# Patient Record
Sex: Male | Born: 1970 | Race: White | Hispanic: No | Marital: Married | Smoking: Former smoker
Health system: Southern US, Community
[De-identification: ages and names within clinical notes are randomized; demographics above are authoritative.]

## PROBLEM LIST (undated history)

## (undated) DIAGNOSIS — S7290XA Unspecified fracture of unspecified femur, initial encounter for closed fracture: Secondary | ICD-10-CM

## (undated) DIAGNOSIS — E785 Hyperlipidemia, unspecified: Secondary | ICD-10-CM

## (undated) DIAGNOSIS — E119 Type 2 diabetes mellitus without complications: Secondary | ICD-10-CM

## (undated) DIAGNOSIS — IMO0002 Reserved for concepts with insufficient information to code with codable children: Secondary | ICD-10-CM

## (undated) DIAGNOSIS — N200 Calculus of kidney: Secondary | ICD-10-CM

## (undated) DIAGNOSIS — I10 Essential (primary) hypertension: Secondary | ICD-10-CM

## (undated) DIAGNOSIS — F419 Anxiety disorder, unspecified: Secondary | ICD-10-CM

## (undated) DIAGNOSIS — F329 Major depressive disorder, single episode, unspecified: Secondary | ICD-10-CM

## (undated) HISTORY — DX: Hyperlipidemia, unspecified: E78.5

## (undated) HISTORY — PX: ROTATOR CUFF REPAIR: SHX139

## (undated) HISTORY — DX: Essential (primary) hypertension: I10

## (undated) HISTORY — DX: Anxiety disorder, unspecified: F41.9

## (undated) HISTORY — DX: Reserved for concepts with insufficient information to code with codable children: IMO0002

## (undated) HISTORY — DX: Calculus of kidney: N20.0

## (undated) HISTORY — PX: HERNIA REPAIR: SHX51

## (undated) HISTORY — DX: Major depressive disorder, single episode, unspecified: F32.9

## (undated) HISTORY — DX: Type 2 diabetes mellitus without complications: E11.9

## (undated) HISTORY — PX: CERVICAL SPINE SURGERY: SHX589

## (undated) HISTORY — PX: KNEE ARTHROSCOPY: SUR90

## (undated) HISTORY — DX: Unspecified fracture of unspecified femur, initial encounter for closed fracture: S72.90XA

---
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
## 1989-09-21 DIAGNOSIS — S7290XA Unspecified fracture of unspecified femur, initial encounter for closed fracture: Secondary | ICD-10-CM

## 1989-09-21 HISTORY — DX: Unspecified fracture of unspecified femur, initial encounter for closed fracture: S72.90XA

## 1991-09-22 DIAGNOSIS — IMO0002 Reserved for concepts with insufficient information to code with codable children: Secondary | ICD-10-CM

## 1991-09-22 HISTORY — PX: TRICEPS TENDON REPAIR: SHX2577

## 1991-09-22 HISTORY — DX: Reserved for concepts with insufficient information to code with codable children: IMO0002

## 1998-09-21 HISTORY — PX: KIDNEY STONE SURGERY: SHX686

## 1998-09-21 LAB — HM COLONOSCOPY: HM Colonoscopy: NORMAL

## 2005-11-29 ENCOUNTER — Emergency Department: Payer: Self-pay | Admitting: Emergency Medicine

## 2008-03-13 ENCOUNTER — Encounter: Payer: Self-pay | Admitting: Family Medicine

## 2008-04-21 DIAGNOSIS — F32A Depression, unspecified: Secondary | ICD-10-CM

## 2008-04-21 HISTORY — DX: Depression, unspecified: F32.A

## 2008-07-27 ENCOUNTER — Ambulatory Visit: Payer: Self-pay | Admitting: Family Medicine

## 2008-07-27 DIAGNOSIS — N200 Calculus of kidney: Secondary | ICD-10-CM | POA: Insufficient documentation

## 2008-07-27 DIAGNOSIS — H60399 Other infective otitis externa, unspecified ear: Secondary | ICD-10-CM | POA: Insufficient documentation

## 2008-07-27 DIAGNOSIS — H6690 Otitis media, unspecified, unspecified ear: Secondary | ICD-10-CM | POA: Insufficient documentation

## 2008-07-27 DIAGNOSIS — F411 Generalized anxiety disorder: Secondary | ICD-10-CM | POA: Insufficient documentation

## 2008-07-31 ENCOUNTER — Ambulatory Visit: Payer: Self-pay | Admitting: Professional

## 2008-08-03 ENCOUNTER — Ambulatory Visit: Payer: Self-pay | Admitting: Family Medicine

## 2008-08-03 DIAGNOSIS — E785 Hyperlipidemia, unspecified: Secondary | ICD-10-CM | POA: Insufficient documentation

## 2008-08-06 DIAGNOSIS — R7309 Other abnormal glucose: Secondary | ICD-10-CM | POA: Insufficient documentation

## 2008-08-07 LAB — CONVERTED CEMR LAB
Albumin: 4 g/dL (ref 3.5–5.2)
Alkaline Phosphatase: 56 units/L (ref 39–117)
BUN: 10 mg/dL (ref 6–23)
Bilirubin, Direct: 0.1 mg/dL (ref 0.0–0.3)
Calcium: 9.5 mg/dL (ref 8.4–10.5)
Creatinine, Ser: 0.8 mg/dL (ref 0.4–1.5)
Sodium: 144 meq/L (ref 135–145)
Total Bilirubin: 0.7 mg/dL (ref 0.3–1.2)
Total Protein: 6.7 g/dL (ref 6.0–8.3)
Triglycerides: 255 mg/dL (ref 0–149)

## 2008-08-13 ENCOUNTER — Telehealth: Payer: Self-pay | Admitting: Family Medicine

## 2008-08-29 ENCOUNTER — Telehealth: Payer: Self-pay | Admitting: Family Medicine

## 2008-08-30 ENCOUNTER — Ambulatory Visit: Payer: Self-pay | Admitting: Family Medicine

## 2008-09-24 ENCOUNTER — Telehealth: Payer: Self-pay | Admitting: Family Medicine

## 2008-11-09 ENCOUNTER — Ambulatory Visit: Payer: Self-pay | Admitting: Family Medicine

## 2008-11-21 LAB — CONVERTED CEMR LAB
CO2: 31 meq/L (ref 19–32)
Calcium: 9.5 mg/dL (ref 8.4–10.5)
Direct LDL: 192.9 mg/dL
GFR calc Af Amer: 139 mL/min
GFR calc non Af Amer: 115 mL/min
Sodium: 143 meq/L (ref 135–145)
Total CHOL/HDL Ratio: 8.9
Triglycerides: 241 mg/dL (ref 0–149)
VLDL: 48 mg/dL — ABNORMAL HIGH (ref 0–40)

## 2009-02-11 ENCOUNTER — Ambulatory Visit: Payer: Self-pay | Admitting: Family Medicine

## 2009-02-11 DIAGNOSIS — J441 Chronic obstructive pulmonary disease with (acute) exacerbation: Secondary | ICD-10-CM | POA: Insufficient documentation

## 2009-02-11 DIAGNOSIS — J019 Acute sinusitis, unspecified: Secondary | ICD-10-CM | POA: Insufficient documentation

## 2009-03-01 ENCOUNTER — Ambulatory Visit: Payer: Self-pay | Admitting: Family Medicine

## 2009-03-04 LAB — CONVERTED CEMR LAB
Cholesterol: 274 mg/dL — ABNORMAL HIGH (ref 0–200)
Direct LDL: 194.8 mg/dL
Total CHOL/HDL Ratio: 10
Triglycerides: 287 mg/dL — ABNORMAL HIGH (ref 0.0–149.0)

## 2009-06-07 ENCOUNTER — Ambulatory Visit: Payer: Self-pay | Admitting: Family Medicine

## 2009-06-13 LAB — CONVERTED CEMR LAB
ALT: 25 units/L (ref 0–53)
AST: 21 units/L (ref 0–37)
Total CHOL/HDL Ratio: 7
Triglycerides: 299 mg/dL — ABNORMAL HIGH (ref 0.0–149.0)
VLDL: 59.8 mg/dL — ABNORMAL HIGH (ref 0.0–40.0)

## 2009-09-21 HISTORY — PX: LITHOTRIPSY: SUR834

## 2009-10-07 ENCOUNTER — Telehealth: Payer: Self-pay | Admitting: Family Medicine

## 2009-11-06 ENCOUNTER — Ambulatory Visit: Payer: Self-pay | Admitting: Family Medicine

## 2009-11-06 DIAGNOSIS — M549 Dorsalgia, unspecified: Secondary | ICD-10-CM | POA: Insufficient documentation

## 2009-11-06 LAB — CONVERTED CEMR LAB
Ketones, urine, test strip: NEGATIVE
Nitrite: NEGATIVE
Specific Gravity, Urine: >=1.03
WBC Urine, dipstick: NEGATIVE

## 2009-11-07 ENCOUNTER — Ambulatory Visit: Payer: Self-pay | Admitting: Cardiology

## 2009-11-12 ENCOUNTER — Encounter: Payer: Self-pay | Admitting: Family Medicine

## 2009-12-13 ENCOUNTER — Ambulatory Visit: Payer: Self-pay | Admitting: Family Medicine

## 2009-12-18 LAB — CONVERTED CEMR LAB
Cholesterol: 207 mg/dL — ABNORMAL HIGH (ref 0–200)
Total CHOL/HDL Ratio: 7
Triglycerides: 317 mg/dL — ABNORMAL HIGH (ref 0.0–149.0)
VLDL: 63.4 mg/dL — ABNORMAL HIGH (ref 0.0–40.0)

## 2009-12-19 ENCOUNTER — Ambulatory Visit (HOSPITAL_COMMUNITY): Admission: RE | Admit: 2009-12-19 | Discharge: 2009-12-19 | Payer: Self-pay | Admitting: Urology

## 2010-01-30 ENCOUNTER — Encounter: Payer: Self-pay | Admitting: Family Medicine

## 2010-02-18 ENCOUNTER — Encounter: Payer: Self-pay | Admitting: Family Medicine

## 2010-06-14 ENCOUNTER — Emergency Department: Payer: Self-pay | Admitting: Unknown Physician Specialty

## 2010-08-21 ENCOUNTER — Ambulatory Visit: Payer: Self-pay | Admitting: Family Medicine

## 2010-08-28 ENCOUNTER — Telehealth: Payer: Self-pay | Admitting: Family Medicine

## 2010-09-23 ENCOUNTER — Telehealth: Payer: Self-pay | Admitting: Family Medicine

## 2010-10-02 ENCOUNTER — Ambulatory Visit
Admission: RE | Admit: 2010-10-02 | Discharge: 2010-10-02 | Payer: Self-pay | Source: Home / Self Care | Attending: Family Medicine | Admitting: Family Medicine

## 2010-10-12 ENCOUNTER — Encounter: Payer: Self-pay | Admitting: Urology

## 2010-10-20 ENCOUNTER — Encounter: Payer: Self-pay | Admitting: Family Medicine

## 2010-10-21 NOTE — Assessment & Plan Note (Signed)
Summary: NEEDS TO GO BACK ON MEDS/DLO   Vital Signs:  Patient profile:   40 year old male Height:      67.5 inches Weight:      195.25 pounds BMI:     30.24 Temp:     98.4 degrees F oral Pulse rate:   84 / minute Pulse rhythm:   regular BP sitting:   122 / 92  (left arm) Cuff size:   large  Vitals Entered By: Delilah Shan CMA Fujie Dickison Dull) (August 21, 2010 3:31 PM) CC: Needs to go back on medications   History of Present Illness: Wanted to start back on the celexa and klonopin.  Wife had noted that patient was more irritable.  Prev had been on both for a few years.  H/o relationship trouble and financial stress led to the start of SSRI.  Compounded by loss of job, death of friend, and family illnesses.  He did well with both with anxiety and depression.    He's finishing a criminal justice degree.  "I feel worthless."  Worried about finances and job search.  Unemployed currently.  No SI/HI.  Contracts for safety.  No charges pending.  No h/o charges.  No illicits.  Smokes.  Rare alcohol.    Had cystoscopy for hematuria eval.  This was negative per patient.  Done by Dr. Vonita Moss.  H/o lithotripsy.   Allergies: 1)  ! * Dilaudid 2)  ! Chantix  Past History:  Past Medical History: Depression renal stones- Dr. Vonita Moss with Uro Hematuria eval 2011 with uro  Family History: Reviewed history from 07/27/2008 and no changes required. father: stress, MI age 50 mother: Von Willebrand's dis, DDD, dverticulitis MGM: CHF PGF: lung cancer PGM: breast cancer MGF: colon cancer uncle colon cancer age 60, niece colon cancer age 29  Social History: Reviewed history from 07/27/2008 and no changes required. Occupation: unemployed currently Married 2 children Current Smoker 23 pack year hisotry Diet: fast food, some fried foods, mainly grilled Exercise: disc golf, golf, hunting, fishing rare alcohol.  no illicits  Review of Systems       See HPI.  Otherwise negative.    Physical  Exam  General:  GEN: nad, alert and oriented, affect slighly flat but speech wnl HEENT: mucous membranes moist NECK: supple w/o LA CV: rrr PULM: ctab, no inc wob ABD: soft, +bs EXT: no edema SKIN: no acute rash    Impression & Recommendations:  Problem # 1:  DEPRESSION (ICD-311) >25 min spent with patient, at least half of which was spent on counseling re dx.  Will restart SSRI, contracts for safety, will call back with update.  Aware of potential libido effect.  Sedation caution for BZD.  d/w patient about not drinking to excess and avoiding illicits.  He understands.  D/w patient WU:XLKGMWN counselling.  He understood. Call back as needed.  The following medications were removed from the medication list:    Clonazepam 1 Mg Tabs (Clonazepam) .Marland Kitchen... Take 1 tablet by mouth daily as needed    Citalopram Hydrobromide 20 Mg Tabs (Citalopram hydrobromide) .Marland Kitchen... Take 1-1/2 tablets by mouth once daily His updated medication list for this problem includes:    Celexa 20 Mg Tabs (Citalopram hydrobromide) .Marland Kitchen... 1 by mouth once daily    Klonopin 1 Mg Tabs (Clonazepam) .Marland Kitchen... 0.5-1 tab by mouth three times a day as needed for anxiety  Complete Medication List: 1)  Simvastatin 40 Mg Tabs (Simvastatin) .Marland Kitchen.. 1 tab by mouth daily 2)  Percocet 7.5-500  Mg Tabs (Oxycodone-acetaminophen) .Marland Kitchen.. 1 tab every 6 hours as needed pain. 3)  Celexa 20 Mg Tabs (Citalopram hydrobromide) .Marland Kitchen.. 1 by mouth once daily 4)  Klonopin 1 Mg Tabs (Clonazepam) .... 0.5-1 tab by mouth three times a day as needed for anxiety  Patient Instructions: 1)  Start the celexa today and let me know how you are doing next week.  Call me on Thursday- ask to leave me a message.  Take the klonopin three times a day as needed, starting with a 1/2 tab.  It can make you drowsy.  I want to see you back in about 6 or 8 weeks- sooner as needed.  Take care.  I would think about marriage counseling.  Prescriptions: KLONOPIN 1 MG TABS (CLONAZEPAM) 0.5-1  tab by mouth three times a day as needed for anxiety  #40 x 1   Entered and Authorized by:   Crawford Givens MD   Signed by:   Crawford Givens MD on 08/21/2010   Method used:   Print then Give to Patient   RxID:   [id number] CELEXA 20 MG TABS (CITALOPRAM HYDROBROMIDE) 1 by mouth once daily  #30 x 5   Entered and Authorized by:   Crawford Givens MD   Signed by:   Crawford Givens MD on 08/21/2010   Method used:   Print then Give to Patient   RxID:   [id number]    Orders Added: 1)  Est. Patient Level IV [84696]    Current Allergies (reviewed today): ! * DILAUDID ! CHANTIX

## 2010-10-21 NOTE — Assessment & Plan Note (Signed)
Summary: KIDNEY STONE/[facility]   Vital Signs:  Patient profile:   40 year old male Height:      67.5 inches Weight:      194.25 pounds BMI:     30.08 Temp:     98.4 degrees F oral Pulse rate:   84 / minute Pulse rhythm:   regular BP sitting:   150 / 86  (left arm) Cuff size:   regular  Vitals Entered By: Delilah Shan CMA Duncan Dull) (November 06, 2009 4:08 PM) CC: ? kidney stone   History of Present Illness: 40 yo with h/o kidney stones presents with 3 days of severe left sideded pain. Getting worse, started left back, now radiating to groin. This morning, pain with urination, hematuria. No fevers, chills. No n/v/d. Has passed multiple stones in past, but feels like he can't pass this, pain is getting worse, not better. Cannot remember name of prior urologist. Kidney stone extraction in 2000.   Current Medications (verified): 1)  Clonazepam 1 Mg Tabs (Clonazepam) .... Take 1 Tablet By Mouth Daily As Needed 2)  Citalopram Hydrobromide 20 Mg Tabs (Citalopram Hydrobromide) .... Take 1-1/2 Tablets By Mouth Once Daily 3)  Vitamin C 500 Mg  Tabs (Ascorbic Acid) .... Take 1 Tablet By Mouth Once A Day 4)  Multivitamins   Tabs (Multiple Vitamin) .... Take 1 Tablet By Mouth Once A Day 5)  Simvastatin 40 Mg Tabs (Simvastatin) .Marland Kitchen.. 1 Tab By Mouth Daily 6)  Percocet 7.5-500 Mg Tabs (Oxycodone-Acetaminophen) .Marland Kitchen.. 1 Tab Every 6 Hours As Needed Pain.  Allergies: 1)  ! * Dilaudid  Review of Systems      See HPI General:  Denies chills and fever. GI:  Denies nausea and vomiting. GU:  Complains of dysuria and hematuria; denies incontinence, urinary frequency, and urinary hesitancy.  Physical Exam  General:  fatoigued appearing male, appears uncomfortable. Mouth:  MMM Abdomen:  no suprapubic tenderness. Pos left CVA tenderness. Psych:  normally interactive and good eye contact.     Impression & Recommendations:  Problem # 1:  BACK PAIN, LEFT (ICD-724.5) Assessment New  Most likely  nephrolithaisis, will send for CT abd/pelvis to rule out obstructing calculs.   UA pos for hematuria.  Percocet as needed pain.  LIkely will need urology referral. The following medications were removed from the medication list:    Hydrocodone-acetaminophen 10-650 Mg Tabs (Hydrocodone-acetaminophen) .Marland Kitchen... 1 tab by mouth q6 hours as needed pain His updated medication list for this problem includes:    Percocet 7.5-500 Mg Tabs (Oxycodone-acetaminophen) .Marland Kitchen... 1 tab every 6 hours as needed pain.  Orders: Radiology Referral (Radiology)  Complete Medication List: 1)  Clonazepam 1 Mg Tabs (Clonazepam) .... Take 1 tablet by mouth daily as needed 2)  Citalopram Hydrobromide 20 Mg Tabs (Citalopram hydrobromide) .... Take 1-1/2 tablets by mouth once daily 3)  Vitamin C 500 Mg Tabs (Ascorbic acid) .... Take 1 tablet by mouth once a day 4)  Multivitamins Tabs (Multiple vitamin) .... Take 1 tablet by mouth once a day 5)  Simvastatin 40 Mg Tabs (Simvastatin) .Marland Kitchen.. 1 tab by mouth daily 6)  Percocet 7.5-500 Mg Tabs (Oxycodone-acetaminophen) .Marland Kitchen.. 1 tab every 6 hours as needed pain.  Patient Instructions: 1)  Please stop by to see Shirlee Limerick on your way out. 2)  Please go to ER if symptoms worsen overnight. Prescriptions: PERCOCET 7.5-500 MG TABS (OXYCODONE-ACETAMINOPHEN) 1 tab every 6 hours as needed pain.  #40 x 0   Entered and Authorized by:   Ruthe Mannan MD  Signed by:   Ruthe Mannan MD on 11/06/2009   Method used:   Print then Give to Patient   RxID:   [id number]   Current Allergies (reviewed today): ! * DILAUDID  Laboratory Results   Urine Tests   Date/Time Reported: November 06, 2009 4:14 PM   Routine Urinalysis   Color: orange Appearance: Cloudy Glucose: negative   (Normal Range: Negative) Bilirubin: negative   (Normal Range: Negative) Ketone: negative   (Normal Range: Negative) Spec. Gravity: >=1.030   (Normal Range: 1.003-1.035) Blood: large   (Normal Range: Negative) pH: 6.0    (Normal Range: 5.0-8.0) Protein: 30   (Normal Range: Negative) Urobilinogen: 0.2   (Normal Range: 0-1) Nitrite: negative   (Normal Range: Negative) Leukocyte Esterace: negative   (Normal Range: Negative)

## 2010-10-21 NOTE — Consult Note (Signed)
Summary: [facility]  [facility]   Imported By: Lanelle Bal 11/19/2009 07:49:42  _____________________________________________________________________  External Attachment:    Type:   Image     Comment:   External Document

## 2010-10-21 NOTE — Letter (Signed)
Summary: [facility]  [facility]   Imported By: Lanelle Bal 02/28/2010 07:37:10  _____________________________________________________________________  External Attachment:    Type:   Image     Comment:   External Document

## 2010-10-21 NOTE — Progress Notes (Signed)
Summary: doing better  Phone Note Call from Patient Call back at Home Phone [phone]   Caller: Patient Call For: Dr. Para March  Summary of Call: Patient called to let you know that he is doing better today now that he is on the meds.  Initial call taken by: Melody Comas,  August 28, 2010 4:09 PM  Follow-up for Phone Call        great.  I'll see him in 1/12. Follow-up by: Crawford Givens MD,  August 28, 2010 5:31 PM

## 2010-10-21 NOTE — Letter (Signed)
Summary: [facility]  [facility]   Imported By: Lanelle Bal 02/05/2010 12:50:15  _____________________________________________________________________  External Attachment:    Type:   Image     Comment:   External Document

## 2010-10-21 NOTE — Progress Notes (Signed)
Summary: refill request for clonazepam  Phone Note Refill Request   Refills Requested: Medication #1:  CLONAZEPAM 1 MG TABS Take 1 tablet by mouth three times a day   Last Refilled: 08/13/2008 [facility] from [facility] [phone]  Initial call taken by: Lowella Petties CMA,  October 07, 2009 9:46 AM  Follow-up for Phone Call        Refill once, should limit use..if using more regularly..needs follow up for anxiety med eval to adjust other meds.  Also let him know he is overdue for yearly CPX.  Follow-up by: Kerby Nora MD,  October 07, 2009 4:23 PM  Additional Follow-up for Phone Call Additional follow up Details #1::        rx called in and patient advised.Consuello Masse CMA  Additional Follow-up by: Benny Lennert CMA Duncan Dull),  October 08, 2009 9:57 AM    New/Updated Medications: CLONAZEPAM 1 MG TABS (CLONAZEPAM) Take 1 tablet by mouth daily as needed

## 2010-10-23 NOTE — Progress Notes (Signed)
Summary: Rx Clonazepam  Phone Note Refill Request Call back at [phone] Message from:  [facility] on September 23, 2010 9:46 AM  Refills Requested: Medication #1:  KLONOPIN 1 MG TABS 0.5-1 tab by mouth three times a day as needed for anxiety.   Last Refilled: 09/13/2010  Method Requested: Telephone to Pharmacy Initial call taken by: Sydell Axon LPN,  September 23, 2010 9:48 AM  Follow-up for Phone Call        please call in.  Follow-up by: Crawford Givens MD,  September 23, 2010 11:09 AM  Additional Follow-up for Phone Call Additional follow up Details #1::        Medication phoned to pharmacy.  Additional Follow-up by: Delilah Shan CMA (AAMA),  September 23, 2010 1:00 PM    Prescriptions: KLONOPIN 1 MG TABS (CLONAZEPAM) 0.5-1 tab by mouth three times a day as needed for anxiety  #40 x 1   Entered and Authorized by:   Crawford Givens MD   Signed by:   Crawford Givens MD on 09/23/2010   Method used:   Telephoned to ...       [facility] # [phone]* (retail)       [address]       [place], [place]  [address]       Ph: [phone]       Fax: [phone]   RxID:   [id number]

## 2010-10-23 NOTE — Assessment & Plan Note (Signed)
Summary: FOLLOW-UP 6 -8 WEEK/JRR   Vital Signs:  Patient profile:   40 year old male Height:      67.5 inches Weight:      193.75 pounds BMI:     30.01 Temp:     98.4 degrees F oral Pulse rate:   76 / minute Pulse rhythm:   regular BP sitting:   122 / 80  (left arm) Cuff size:   large  Vitals Entered By: Eric Wells CMA Eric Wells) (October 02, 2010 8:04 AM) CC: Follow up - 6-8 weeks   History of Present Illness: "Things are better".  Less arguing at home with his wife.  He noticed a change after about 1 week on the medicine.  "I'm more motivated to help around the house."  Still in school, still unemployed.  Sex life is better for patient and wife.  Sleeping okay.  Taking klonopin 2-3 per day, down from three times a day most days early on the medicine.  No adverse effect from the medicine.  No SI/HI.    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  After that he'll have a few credits left to graduate.  Hopes to graduate in May and then get a job.  Still with stress related to unemployment.  just picked up the recent rx for klonopin.    Allergies: 1)  ! * Dilaudid 2)  ! Chantix  Past History:  Past Medical History: Last updated: 08/21/2010 Depression renal stones- Dr. Vonita Moss with Uro Hematuria eval 2011 with uro  Social History: Occupation: unemployed currently Married 2 children Current Smoker +20 pack year hisotry Diet: fast food, some fried foods, mainly grilled Exercise: disc golf, golf, hunting, fishing rare alcohol.  no illicits  Review of Systems       See HPI.  Otherwise negative.    Physical Exam  General:  no apparent distress appropriate affect regular rate and rhythm clear to auscultation bilaterally ext w/o edema speech fluent and wnl, insight appears normal   Impression & Recommendations:  Problem # 1:  DEPRESSION (ICD-311) No change in meds.  Improved.  No adverse effect from meds.  No SI/HI.  Okay for outpatient follow up.  He'll  call back as needed, o/w will see in May.   His updated medication list for this problem includes:    Celexa 20 Mg Tabs (Citalopram hydrobromide) .Marland Kitchen... 1 by mouth once daily    Klonopin 1 Mg Tabs (Clonazepam) .Marland Kitchen... 0.5-1 tab by mouth three times a day as needed for anxiety  Complete Medication List: 1)  Simvastatin 40 Mg Tabs (Simvastatin) .Marland Kitchen.. 1 tab by mouth daily 2)  Percocet 7.5-500 Mg Tabs (Oxycodone-acetaminophen) .Marland Kitchen.. 1 tab every 6 hours as needed pain. 3)  Celexa 20 Mg Tabs (Citalopram hydrobromide) .Marland Kitchen.. 1 by mouth once daily 4)  Klonopin 1 Mg Tabs (Clonazepam) .... 0.5-1 tab by mouth three times a day as needed for anxiety  Patient Instructions: 1)  Don't change your meds and let me know when you are running low.  I'd like to see you back in May, sooner as needed.  Take care.    Orders Added: 1)  Est. Patient Level III [16109]    Current Allergies (reviewed today): ! * DILAUDID ! CHANTIX

## 2010-11-06 NOTE — Letter (Signed)
Summary: Historic Patient File  Historic Patient File   Imported By: Kassie Mends 10/28/2010 10:21:05  _____________________________________________________________________  External Attachment:    Type:   Image     Comment:   External Document

## 2010-11-07 ENCOUNTER — Telehealth: Payer: Self-pay | Admitting: Family Medicine

## 2010-11-12 NOTE — Progress Notes (Signed)
Summary: refill request for clonazepam  Phone Note Refill Request Message from:  Fax from Pharmacy  Refills Requested: Medication #1:  KLONOPIN 1 MG TABS 0.5-1 tab by mouth three times a day as needed for anxiety.   Last Refilled: 04/24/70 Faxed request from [facility], [phone].  Initial call taken by: Lowella Petties CMA, AAMA,  November 07, 2010 2:20 PM  Follow-up for Phone Call        Clelia Croft:  Has he changed to you as his primary?  Follow-up by: Kerby Nora MD,  November 07, 2010 2:49 PM  Additional Follow-up for Phone Call Additional follow up Details #1::        I saw him as an acute intially and had been following that up.  My understanding was that there was no explicit change of PMD.  I will fill the rx if you want me to and I can keep seeing him.  thanks. Crawford Givens MD  November 07, 2010 3:05 PM     Additional Follow-up for Phone Call Additional follow up Details #2::    Discussed with Dr. Para March. Pt will follow up with me.   This medication is a long term med or this pt will refill. No suggestion of abuse of med.   Heather/Laurie: Please have this pt change follow up to A CPX in 01/2011 with me.. unless he wants to switch MDs.  Follow-up by: Kerby Nora MD,  November 07, 2010 3:47 PM  Additional Follow-up for Phone Call Additional follow up Details #3:: Details for Additional Follow-up Action Taken: Klonopin called to [facility].  I spoke with pt and he does want to change to Dr. Para March.          Lowella Petties CMA, AAMA  November 07, 2010 4:02 PM  Okay with me. Sound like it is okay with [employer]. Keep follow up as scheduled. Forwarded to [employer] as Lorain Childes. Kerby Nora MD  November 07, 2010 4:09 PM   noted.  please change the heading on the patient's banner to list me as primary.  Crawford Givens MD  November 07, 2010 4:41 PM  Medication phoned to pharmacy.   Heading on patient banner changed. Additional Follow-up by: Delilah Shan CMA Duncan Dull),  November 07, 2010  4:49 PM  Prescriptions: KLONOPIN 1 MG TABS (CLONAZEPAM) 0.5-1 tab by mouth three times a day as needed for anxiety  #60 x 0   Entered and Authorized by:   Kerby Nora MD   Signed by:   Kerby Nora MD on 11/07/2010   Method used:   Telephoned to ...       [facility] # [phone]* (retail)       [address]       [place], [place]  [address]       Ph: [phone]       Fax: [phone]   RxID:   [id number]

## 2010-12-01 ENCOUNTER — Encounter: Payer: Self-pay | Admitting: Family Medicine

## 2010-12-05 ENCOUNTER — Telehealth: Payer: Self-pay | Admitting: Family Medicine

## 2010-12-18 NOTE — Progress Notes (Signed)
Summary: Clonazepam  Phone Note Refill Request Message from:  Pharmacy on December 05, 2010 5:19 PM  Refills Requested: Medication #1:  KLONOPIN 1 MG TABS 0.5-1 tab by mouth three times a day as needed for anxiety. [facility] [address] [address]   Method Requested: Telephone to Pharmacy Initial call taken by: Delilah Shan CMA Clois Treanor Dull),  December 05, 2010 5:19 PM  Follow-up for Phone Call        please call in. Crawford Givens MD  December 07, 2010 11:21 AM   Medication phoned to pharmacy. [name] Fuquay CMA (AAMA)  December 08, 2010 8:59 AM     Prescriptions: KLONOPIN 1 MG TABS (CLONAZEPAM) 0.5-1 tab by mouth three times a day as needed for anxiety  #60 x 0   Entered and Authorized by:   Crawford Givens MD   Signed by:   Crawford Givens MD on 12/07/2010   Method used:   Telephoned to ...       [facility] # [phone]* (retail)       [address]       [place], [place]  [address]       Ph: [phone]       Fax: [phone]   RxID:   [id number]

## 2011-01-12 ENCOUNTER — Other Ambulatory Visit: Payer: Self-pay | Admitting: *Deleted

## 2011-01-12 MED ORDER — CLONAZEPAM 1 MG PO TABS: 1.0000 mg | ORAL_TABLET | Freq: Three times a day (TID) | ORAL | Status: DC | PRN

## 2011-01-13 NOTE — Telephone Encounter (Signed)
rx called to pharmacy 

## 2011-01-30 ENCOUNTER — Encounter: Payer: Self-pay | Admitting: Family Medicine

## 2011-01-30 ENCOUNTER — Ambulatory Visit (INDEPENDENT_AMBULATORY_CARE_PROVIDER_SITE_OTHER): Payer: BC Managed Care – PPO | Admitting: Family Medicine

## 2011-01-30 VITALS — BP 132/82 | HR 80 | Temp 98.5°F | Wt 198.0 lb

## 2011-01-30 DIAGNOSIS — N200 Calculus of kidney: Secondary | ICD-10-CM

## 2011-01-30 DIAGNOSIS — F3289 Other specified depressive episodes: Secondary | ICD-10-CM

## 2011-01-30 DIAGNOSIS — F329 Major depressive disorder, single episode, unspecified: Secondary | ICD-10-CM

## 2011-01-30 DIAGNOSIS — E7800 Pure hypercholesterolemia, unspecified: Secondary | ICD-10-CM

## 2011-01-30 MED ORDER — CLONAZEPAM 1 MG PO TABS: 0.5000 mg | ORAL_TABLET | Freq: Three times a day (TID) | ORAL | Status: DC | PRN

## 2011-01-30 MED ORDER — OXYCODONE-ACETAMINOPHEN 7.5-500 MG PO TABS: 1.0000 | ORAL_TABLET | Freq: Four times a day (QID) | ORAL | Status: DC | PRN

## 2011-01-30 MED ORDER — CITALOPRAM HYDROBROMIDE 20 MG PO TABS: 20.0000 mg | ORAL_TABLET | Freq: Every day | ORAL | Status: DC

## 2011-01-30 NOTE — Assessment & Plan Note (Signed)
He'll use pain meds as needed and fu with uro prn.

## 2011-01-30 NOTE — Progress Notes (Signed)
Depression.  Is finishing school and is looking for work.  Waiting to hear back and wanting to work.  Less arguing with wife now.  Sleeping okay.  No ADE from the meds.    Taking klonopin most days with some relief of anxiety.  Is planning on getting back to exercise.   No SI/HI.    Tried to quit smoking but then started back.  H/o renal stones and had lithotripsy last year. H/o 5 stones last year.  Out of percocet.  Normally sees Uro episodically.    Compliant with chol meds.  Due for labs later this year.    Meds, vitals, and allergies reviewed.   ROS: See HPI.  Otherwise, noncontributory.  GEN: nad, alert and oriented, pleasant in conversation. NECK: supple w/o LA CV: rrr.  no murmur PULM: ctab, no inc wob EXT: no edema SKIN: no acute rash

## 2011-01-30 NOTE — Assessment & Plan Note (Signed)
Stable for outpatient fu.  I expect this to improve as he gets back to work.  No SI/HI.  Continue current meds.  Overall improved from prev.

## 2011-01-30 NOTE — Assessment & Plan Note (Signed)
Return for labs later this year.

## 2011-01-30 NOTE — Patient Instructions (Signed)
Keep taking the meds as you have been.  Work on exercising more and we'll check your cholesterol in August. Come for an office visit a few days after your labs are done.  Take care.

## 2011-03-19 ENCOUNTER — Other Ambulatory Visit: Payer: Self-pay | Admitting: *Deleted

## 2011-03-19 MED ORDER — OXYCODONE-ACETAMINOPHEN 7.5-500 MG PO TABS: 1.0000 | ORAL_TABLET | Freq: Four times a day (QID) | ORAL | Status: DC | PRN

## 2011-03-19 NOTE — Telephone Encounter (Signed)
Give to pt and have him f/u if pain continues.  Thanks.

## 2011-03-19 NOTE — Telephone Encounter (Signed)
Patient advised that rx is ready. Rx left up front.

## 2011-03-19 NOTE — Telephone Encounter (Signed)
Pt has a history of kidney stones and he thinks he is about to pass one.  He is having a lot of pain in right side and in penis.  He is asking for a refill on percocet, doesn't want a lot, just enough to get him through till stones pass and for a while after.  Please call when ready.

## 2011-04-17 ENCOUNTER — Other Ambulatory Visit: Payer: Self-pay | Admitting: *Deleted

## 2011-04-17 MED ORDER — CLONAZEPAM 1 MG PO TABS: 0.5000 mg | ORAL_TABLET | Freq: Three times a day (TID) | ORAL | Status: DC | PRN

## 2011-04-17 NOTE — Telephone Encounter (Signed)
Last refill 03/20/2011.

## 2011-04-17 NOTE — Telephone Encounter (Signed)
Was given verbal okay to call in #60 w/ 0.  Rx called in.

## 2011-04-28 ENCOUNTER — Other Ambulatory Visit: Payer: Self-pay | Admitting: *Deleted

## 2011-04-28 ENCOUNTER — Other Ambulatory Visit (INDEPENDENT_AMBULATORY_CARE_PROVIDER_SITE_OTHER): Payer: BC Managed Care – PPO | Admitting: Family Medicine

## 2011-04-28 DIAGNOSIS — E7800 Pure hypercholesterolemia, unspecified: Secondary | ICD-10-CM

## 2011-04-28 LAB — COMPREHENSIVE METABOLIC PANEL
ALT: 18 U/L (ref 0–53)
Albumin: 4.4 g/dL (ref 3.5–5.2)
Alkaline Phosphatase: 75 U/L (ref 39–117)
Glucose, Bld: 100 mg/dL — ABNORMAL HIGH (ref 70–99)
Potassium: 4.2 mEq/L (ref 3.5–5.1)
Sodium: 142 mEq/L (ref 135–145)
Total Bilirubin: 1.2 mg/dL (ref 0.3–1.2)
Total Protein: 7.6 g/dL (ref 6.0–8.3)

## 2011-04-28 LAB — LIPID PANEL
HDL: 33.4 mg/dL — ABNORMAL LOW (ref >39.00)
Total CHOL/HDL Ratio: 5
Triglycerides: 205 mg/dL — ABNORMAL HIGH (ref 0.0–149.0)

## 2011-04-28 MED ORDER — OXYCODONE-ACETAMINOPHEN 7.5-500 MG PO TABS: 1.0000 | ORAL_TABLET | Freq: Four times a day (QID) | ORAL | Status: DC | PRN

## 2011-04-28 NOTE — Telephone Encounter (Signed)
Pt is here, waiting for refill.

## 2011-04-28 NOTE — Telephone Encounter (Signed)
Script given to patient

## 2011-04-28 NOTE — Telephone Encounter (Signed)
Please give to pt.  

## 2011-04-29 ENCOUNTER — Encounter: Payer: Self-pay | Admitting: Family Medicine

## 2011-04-29 ENCOUNTER — Ambulatory Visit (INDEPENDENT_AMBULATORY_CARE_PROVIDER_SITE_OTHER): Payer: BC Managed Care – PPO | Admitting: Family Medicine

## 2011-04-29 DIAGNOSIS — E7800 Pure hypercholesterolemia, unspecified: Secondary | ICD-10-CM

## 2011-04-29 DIAGNOSIS — R51 Headache: Secondary | ICD-10-CM

## 2011-04-29 DIAGNOSIS — R519 Headache, unspecified: Secondary | ICD-10-CM | POA: Insufficient documentation

## 2011-04-29 MED ORDER — BUTALBITAL-APAP-CAFFEINE 50-325-40 MG PO TABS: 1.0000 | ORAL_TABLET | Freq: Four times a day (QID) | ORAL | Status: DC | PRN

## 2011-04-29 NOTE — Patient Instructions (Signed)
Try to cut down on the smoking and mountain dew (gradually).  Use the fioricet if you have a headache.  Take care.  Cancel the appointment next week.  Let me know if the headaches aren't getting better.

## 2011-04-29 NOTE — Assessment & Plan Note (Signed)
Improved, no change in meds. Continue to work on weight.

## 2011-04-29 NOTE — Progress Notes (Signed)
Headaches.  "I don't know if it's stress related."  He just got his degree and wanted to get into investigation work.  He's still looking for work and this is bothering him.  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  He's having some difficult interactions with his wife and she's been at the doctor recently (her libido is decreased).  He's still working through a loan modification for his home  Advil/tylenol/BCs don't help the pain.  He took fioricet with some relief.  He passed the kidney stone and is not on the percocet now.  He's not using percocet for the HA.    HA is frontal, bilateral.  Occ in the back, occ "the whole head."  Present most days.  No aura with the headaches.  Occ photosensitivity.   Elevated Cholesterol: Using medications without problems:yes Muscle aches: no Other complaints: see above.   Labs d/w pt.   Smoking 1PPD.  He's working on cutting down.  He's cutting out caffeine, drinking more water now, but still with several 12oz Mtn Dews a day.    Meds, vitals, and allergies reviewed.   ROS: See HPI.  Otherwise, noncontributory.  GEN: nad, alert and oriented HEENT: mucous membranes moist, tm wnl, sinuses not ttp NECK: supple w/o LA CV: rrr PULM: ctab, no inc wob ABD: soft, +bs EXT: no edema SKIN: no acute rash CN 2-12 wnl B, S/S/DTR wnl x4

## 2011-04-29 NOTE — Assessment & Plan Note (Signed)
Migraine vs caffeine HA vs chronic daily HA vs nsaid w/d HA.  Stress may play a role in this.  D/w pt about stopping smoking, cutting down caffeine and using fiorcet prn in meantime.  The fioricet is not a long term solution and pt is aware.  Nontoxic and okay for outpatient f/u.  If worsening, then notify the clinic. >25 min spent with face to face with patient, >50% counseling.

## 2011-05-11 ENCOUNTER — Ambulatory Visit: Payer: BC Managed Care – PPO | Admitting: Family Medicine

## 2011-05-27 ENCOUNTER — Other Ambulatory Visit: Payer: Self-pay | Admitting: *Deleted

## 2011-05-27 MED ORDER — BUTALBITAL-APAP-CAFFEINE 50-325-40 MG PO TABS: 1.0000 | ORAL_TABLET | Freq: Four times a day (QID) | ORAL | Status: DC | PRN

## 2011-05-27 NOTE — Telephone Encounter (Signed)
Medication phoned to pharmacy.  

## 2011-05-27 NOTE — Telephone Encounter (Signed)
Please call in.  Thanks.   

## 2011-06-03 ENCOUNTER — Other Ambulatory Visit: Payer: Self-pay | Admitting: *Deleted

## 2011-06-04 MED ORDER — CLONAZEPAM 1 MG PO TABS: 0.5000 mg | ORAL_TABLET | Freq: Three times a day (TID) | ORAL | Status: DC | PRN

## 2011-06-04 NOTE — Telephone Encounter (Signed)
Medication phoned to pharmacy.  

## 2011-06-04 NOTE — Telephone Encounter (Signed)
Please call in

## 2011-06-09 ENCOUNTER — Other Ambulatory Visit: Payer: Self-pay | Admitting: Family Medicine

## 2011-06-10 ENCOUNTER — Other Ambulatory Visit: Payer: Self-pay | Admitting: *Deleted

## 2011-06-10 MED ORDER — OXYCODONE-ACETAMINOPHEN 7.5-500 MG PO TABS: 1.0000 | ORAL_TABLET | Freq: Four times a day (QID) | ORAL | Status: DC | PRN

## 2011-06-10 NOTE — Telephone Encounter (Signed)
Pt is asking for refill on fioricet for his headaches, this is helping.  Also, he is asking for a refill on percocet.  He passed a kidney stone Sunday night but thinks he has more because he's still having a lot of pain.  He has a history of multiple kidney stones, he had 5 last year.  Please call when scripts are ready.

## 2011-06-10 NOTE — Telephone Encounter (Signed)
Will refill percocet.  Should have fioricet refill at pharmacy.  To check there first.

## 2011-06-10 NOTE — Telephone Encounter (Signed)
Patient notified and Rx up front for patient pick up.

## 2011-06-11 ENCOUNTER — Other Ambulatory Visit: Payer: Self-pay | Admitting: *Deleted

## 2011-06-11 MED ORDER — BUTALBITAL-APAP-CAFFEINE 50-325-40 MG PO TABS: 1.0000 | ORAL_TABLET | Freq: Four times a day (QID) | ORAL | Status: DC | PRN

## 2011-06-11 NOTE — Telephone Encounter (Signed)
Pt had requested refill yesterday, along with percocet.

## 2011-06-11 NOTE — Telephone Encounter (Signed)
Rx called into pharmacy as directed.

## 2011-06-11 NOTE — Telephone Encounter (Signed)
Please see previous refill instructions.

## 2011-06-23 ENCOUNTER — Other Ambulatory Visit: Payer: Self-pay | Admitting: *Deleted

## 2011-06-23 NOTE — Telephone Encounter (Signed)
Last refill 06/11/2011.

## 2011-06-24 MED ORDER — BUTALBITAL-APAP-CAFFEINE 50-325-40 MG PO TABS: 1.0000 | ORAL_TABLET | Freq: Four times a day (QID) | ORAL | Status: DC | PRN

## 2011-06-24 NOTE — Telephone Encounter (Signed)
Please call in.  If pt continues to need frequently, then have him scheduled for visit to discuss.  Thanks.

## 2011-06-24 NOTE — Telephone Encounter (Signed)
Medication phoned to pharmacy.  Patient advised to schedule 30 min appt if he is needing this medication frequently.  He says he will phone back in for that appt.

## 2011-07-03 ENCOUNTER — Encounter: Payer: Self-pay | Admitting: Family Medicine

## 2011-07-03 ENCOUNTER — Ambulatory Visit (INDEPENDENT_AMBULATORY_CARE_PROVIDER_SITE_OTHER): Payer: BC Managed Care – PPO | Admitting: Family Medicine

## 2011-07-03 DIAGNOSIS — R1909 Other intra-abdominal and pelvic swelling, mass and lump: Secondary | ICD-10-CM

## 2011-07-03 DIAGNOSIS — R1900 Intra-abdominal and pelvic swelling, mass and lump, unspecified site: Secondary | ICD-10-CM

## 2011-07-03 DIAGNOSIS — N200 Calculus of kidney: Secondary | ICD-10-CM

## 2011-07-03 DIAGNOSIS — J441 Chronic obstructive pulmonary disease with (acute) exacerbation: Secondary | ICD-10-CM

## 2011-07-03 MED ORDER — OXYCODONE-ACETAMINOPHEN 7.5-500 MG PO TABS: 1.0000 | ORAL_TABLET | Freq: Four times a day (QID) | ORAL | Status: DC | PRN

## 2011-07-03 MED ORDER — SULFAMETHOXAZOLE-TRIMETHOPRIM 800-160 MG PO TABS: 2.0000 | ORAL_TABLET | Freq: Two times a day (BID) | ORAL | Status: AC

## 2011-07-03 NOTE — Patient Instructions (Signed)
Take the antibiotics and use a warm compress several times a day.  Let me know if it doesn't improve.  Take care.   Use the percocet if you have another stone.  If you continue to have trouble, let me know so we can set up a referral to urology closer to your work.

## 2011-07-03 NOTE — Progress Notes (Signed)
Lump in groin.  L sided.  Next to the scrotum.  No dysuria, rash, discharge.  STD testing offered, declined.  The area is tender and it had drained some prev.  It's less tender and it's smaller now.    H/o nephrolithiasis.  He needs an other percocet rx in case he has another episode of pain.    Change in job and home stress continues.  He's trying to work through this, but it hasn't helped with this HA frequency.    Smoking.  Also with dust exposure at work.  See plan. .  Meds, vitals, and allergies reviewed.   ROS: See HPI.  Otherwise, noncontributory.  nad ncat Mmm rrr No focal dec in BS but coarse BS noted occ dry cough Abd soft, not ttp L groin with mass lateral to scrotum- no change with cough.  It appears to be an ~1cm area that was a likely cyst or boil that has already drained. No erythema; slighlty ttp.  No discharge.  Normal ext genitalia o/w and no hernia noted.

## 2011-07-05 ENCOUNTER — Encounter: Payer: Self-pay | Admitting: Family Medicine

## 2011-07-05 DIAGNOSIS — R1909 Other intra-abdominal and pelvic swelling, mass and lump: Secondary | ICD-10-CM | POA: Insufficient documentation

## 2011-07-05 NOTE — Assessment & Plan Note (Signed)
Abx, warm compresses and no need to I&D today.  Okay for outpatient f/u, call back as needed.

## 2011-07-05 NOTE — Assessment & Plan Note (Signed)
No acute flare today, but with smoking I talked with him about stopping and using a mask at work. He understood and will consider both.  >25 min spent with face to face with patient, >50% counseling and/or coordinating care.

## 2011-07-05 NOTE — Assessment & Plan Note (Signed)
rx printed for percocet to use if he has to pass another stone.  If he continues to have troubles, I want him to f/u with uro.  He agrees.

## 2011-07-06 ENCOUNTER — Telehealth: Payer: Self-pay | Admitting: *Deleted

## 2011-07-06 ENCOUNTER — Emergency Department: Payer: Self-pay | Admitting: Emergency Medicine

## 2011-07-06 NOTE — Telephone Encounter (Signed)
Agreed -

## 2011-07-06 NOTE — Telephone Encounter (Signed)
Pt had called today complaining of pain under right ribs around to side.  His wife, who is a Engineer, civil (consulting), told him he could have appendicitis.  Offered appt around lunch time but he said he would go to ER.  Per Dr. Para March, I told him that if he truly thought he had appendicitis, then he should go to ER.

## 2011-07-16 ENCOUNTER — Other Ambulatory Visit: Payer: Self-pay | Admitting: *Deleted

## 2011-07-16 MED ORDER — BUTALBITAL-APAP-CAFFEINE 50-325-40 MG PO TABS: 1.0000 | ORAL_TABLET | Freq: Four times a day (QID) | ORAL | Status: DC | PRN

## 2011-07-16 NOTE — Telephone Encounter (Signed)
Please call in

## 2011-07-16 NOTE — Telephone Encounter (Signed)
Phoned request from [facility].  Last filled 07/08/11.

## 2011-07-16 NOTE — Telephone Encounter (Signed)
Medication phoned to pharmacy.  

## 2011-07-20 ENCOUNTER — Telehealth: Payer: Self-pay | Admitting: Family Medicine

## 2011-07-20 DIAGNOSIS — N200 Calculus of kidney: Secondary | ICD-10-CM

## 2011-07-20 MED ORDER — OXYCODONE-ACETAMINOPHEN 7.5-500 MG PO TABS: 1.0000 | ORAL_TABLET | Freq: Four times a day (QID) | ORAL | Status: DC | PRN

## 2011-07-20 NOTE — Telephone Encounter (Signed)
Addended by: Lars Mage on: 07/20/2011 01:58 PM   Modules accepted: Orders

## 2011-07-20 NOTE — Telephone Encounter (Signed)
Patient advised Rx ready for pick up.

## 2011-07-20 NOTE — Telephone Encounter (Signed)
Pt called, says he went to [facility] ER 3 weeks ago and was diag w/ a Kidney Stones, measuring  3 to 4 cm. Pt says he has not passed the stone and he is still in a lot of pain. Pt was given Percocet for the pain. Would let to know if PCP would give him  additional medication Call back # [phone]. Pt is presently at work and may need someone else to pick up the prescription.Marland KitchenMarland KitchenMarland Kitchen

## 2011-07-20 NOTE — Telephone Encounter (Signed)
rx printed.  The prev plan was to have him f/u with uro if he had more trouble.  Referral is in.  Thanks.

## 2011-07-20 NOTE — Telephone Encounter (Signed)
Patient advised.

## 2011-07-20 NOTE — Telephone Encounter (Signed)
Pt calling to check on percocet refill request for kidney stone.  Call back is [phone]

## 2011-07-21 ENCOUNTER — Telehealth: Payer: Self-pay | Admitting: Family Medicine

## 2011-07-24 ENCOUNTER — Other Ambulatory Visit: Payer: Self-pay | Admitting: *Deleted

## 2011-07-24 MED ORDER — SIMVASTATIN 40 MG PO TABS: 40.0000 mg | ORAL_TABLET | Freq: Every day | ORAL | Status: DC

## 2011-08-05 ENCOUNTER — Other Ambulatory Visit: Payer: Self-pay | Admitting: Family Medicine

## 2011-08-05 NOTE — Telephone Encounter (Signed)
Pt had a rx sent in, #30, 2 rf on 07/16/11.  If he's used them all, then call in 15 more with same sig, no rf and have pt get OV to discuss long term options.

## 2011-08-05 NOTE — Telephone Encounter (Signed)
Pharmacy states that he has gotten the 07/16/11 Rx and a RF on 07/23/11 and a RF on 07/29/11.  15 tabs were called in with no RF and I LMOVM of cell phone advising pt to call in for 30 min OV to discuss long-term options.

## 2011-08-11 NOTE — Telephone Encounter (Signed)
Error please disregard. MK

## 2011-08-28 ENCOUNTER — Other Ambulatory Visit: Payer: Self-pay | Admitting: Family Medicine

## 2011-08-30 NOTE — Telephone Encounter (Signed)
Please call in

## 2011-09-01 NOTE — Telephone Encounter (Signed)
Medication phoned to pharmacy.  

## 2011-09-10 ENCOUNTER — Ambulatory Visit (INDEPENDENT_AMBULATORY_CARE_PROVIDER_SITE_OTHER): Payer: BC Managed Care – PPO | Admitting: Family Medicine

## 2011-09-10 ENCOUNTER — Encounter: Payer: Self-pay | Admitting: Family Medicine

## 2011-09-10 VITALS — BP 124/78 | Temp 98.4°F | Wt 180.0 lb

## 2011-09-10 DIAGNOSIS — Z23 Encounter for immunization: Secondary | ICD-10-CM

## 2011-09-10 DIAGNOSIS — N200 Calculus of kidney: Secondary | ICD-10-CM

## 2011-09-10 MED ORDER — OXYCODONE-ACETAMINOPHEN 7.5-500 MG PO TABS: 1.0000 | ORAL_TABLET | Freq: Four times a day (QID) | ORAL | Status: DC | PRN

## 2011-09-10 NOTE — Patient Instructions (Signed)
See Shirlee Limerick about your referral before you leave today. I'll request your records.   Take care.  Glad to see you.

## 2011-09-10 NOTE — Progress Notes (Signed)
Pt had gotten some pain meds/oxycodone from Dr. Richardson Landry with [facility].  He was trying to stretch the rx to make it last as long as possible. Per patient he still had 3mm stone in R kidney on CT scan per uro.  Requesting records.  He was offered a scope for extraction but declined. He was still having pain and was seeing blood in urine.  Prev with stone analysis done.  13 stones total in past. He did 24h urine collection prev.    He prev had cystoscopy through Dr. Vonita Moss 2012 and was negative per patient.    He still gets waxing and waning flank pain on the R side.    Prev CT report from [facility] reviewed .  Meds, vitals, and allergies reviewed.   ROS: See HPI.  Otherwise, noncontributory.  nad ncat Mmm rrr ctab  abd soft, not ttp Ext well perfused.

## 2011-09-11 ENCOUNTER — Ambulatory Visit: Payer: BC Managed Care – PPO | Admitting: Family Medicine

## 2011-09-13 NOTE — Assessment & Plan Note (Signed)
With intermittent but recurrent R flank pain and per patient blood in urine.  He still uses tobacco.  He didn't want to f/u with [facility] due to location, driving distance, and didn't get along with Dr. Richardson Landry, per his report.  He needs uro eval. I'll try to get all relevant records in meantime and he can use prn percocet as he does have a known stone on CT.  If he doesn't have sig pathology found, then we'll need to work this up further.  Long term options don't include percocet and he understands this. >25 min spent with face to face with patient, >50% counseling and/or coordinating care.

## 2011-09-19 ENCOUNTER — Emergency Department: Payer: Self-pay | Admitting: Emergency Medicine

## 2011-09-21 ENCOUNTER — Emergency Department: Payer: Self-pay | Admitting: Unknown Physician Specialty

## 2011-09-23 ENCOUNTER — Telehealth: Payer: Self-pay | Admitting: Internal Medicine

## 2011-09-23 NOTE — Telephone Encounter (Signed)
Noted  

## 2011-09-23 NOTE — Telephone Encounter (Signed)
If he has true throat swelling and can't clear his airway, he needs to call 911.  Otherwise, tell him to be here at 4:15 and we'll try to work him in.

## 2011-09-23 NOTE — Telephone Encounter (Signed)
Patient was Rx Clindamycin and Augmentin for his Abscess tooth.  Now his throat is swelled and he is having fever.  Would like to be seen today but no one has an appointment wanted to know if you could work him in to rule out virus infection, streph throat or if it's coming from his abscess tooth.

## 2011-09-23 NOTE — Telephone Encounter (Signed)
Will defer to oral surgery.

## 2011-09-23 NOTE — Telephone Encounter (Signed)
Patient seen a oral surgeon today and he was informed by him that it was coming from the abscess tooth and prescribed him motrin and he is going to have the tooth pulled Friday.  But he wanted to say Thank You for trying to work him in.

## 2011-10-26 ENCOUNTER — Other Ambulatory Visit: Payer: Self-pay | Admitting: Family Medicine

## 2011-10-26 NOTE — Telephone Encounter (Signed)
Electronic refill request

## 2011-10-26 NOTE — Telephone Encounter (Signed)
Please call in

## 2011-10-27 NOTE — Telephone Encounter (Signed)
Medication phoned to pharmacy.  

## 2011-11-04 ENCOUNTER — Telehealth: Payer: Self-pay | Admitting: Family Medicine

## 2011-11-04 MED ORDER — HYDROCODONE-ACETAMINOPHEN 5-500 MG PO TABS: 1.0000 | ORAL_TABLET | Freq: Four times a day (QID) | ORAL | Status: AC | PRN

## 2011-11-04 NOTE — Telephone Encounter (Signed)
Rx called to pharmacy. Patient notified as instructed by telephone, Was advised by patient that he has an appointment scheduled with urologist Dr. Sheppard Penton on 11/10/11.

## 2011-11-04 NOTE — Telephone Encounter (Signed)
10 pills.  He needs to schedule and follow through with uro.

## 2011-11-04 NOTE — Telephone Encounter (Signed)
Patient is still having pain from the kidney stone and never saw the new Urologist in [place] back in December. Wanting to know if you will call him in some vicodin to his pharmacy, [facility] [place]. Pls call him back on the cell phone [phone] if you can call this in for him. He says he is calling the Urologist office today to get rescheduled .

## 2011-11-30 ENCOUNTER — Encounter: Payer: Self-pay | Admitting: Family Medicine

## 2011-11-30 ENCOUNTER — Encounter: Payer: Self-pay | Admitting: *Deleted

## 2011-11-30 ENCOUNTER — Ambulatory Visit (INDEPENDENT_AMBULATORY_CARE_PROVIDER_SITE_OTHER): Payer: BC Managed Care – PPO | Admitting: Family Medicine

## 2011-11-30 VITALS — BP 100/60 | HR 96 | Temp 102.4°F | Ht 68.0 in | Wt 175.8 lb

## 2011-11-30 DIAGNOSIS — F17200 Nicotine dependence, unspecified, uncomplicated: Secondary | ICD-10-CM | POA: Insufficient documentation

## 2011-11-30 DIAGNOSIS — IMO0001 Reserved for inherently not codable concepts without codable children: Secondary | ICD-10-CM | POA: Insufficient documentation

## 2011-11-30 DIAGNOSIS — J111 Influenza due to unidentified influenza virus with other respiratory manifestations: Secondary | ICD-10-CM

## 2011-11-30 MED ORDER — HYDROCODONE-HOMATROPINE 5-1.5 MG/5ML PO SYRP: ORAL_SOLUTION | ORAL | Status: AC

## 2011-11-30 MED ORDER — OSELTAMIVIR PHOSPHATE 75 MG PO CAPS: 75.0000 mg | ORAL_CAPSULE | Freq: Two times a day (BID) | ORAL | Status: AC

## 2011-11-30 NOTE — Progress Notes (Signed)
Patient Name: Eric Wells Date of Birth: Jan 25, 1971 Medical Record Number: [id number] Gender: male  History of Present Illness:  Eric Wells presents with runny nose, sneezing, cough, sore throat, malaise, myalgias, arthralgia, chills, and fever.  ? recent exposure to others with similar symptoms.  Temp is 102 Started on Sat night.  Feels a little dizzy, major headache. Real congested in chest. A bad cough and really a bad headache.  No sore throat.  At least 102.   The patent denies sore throat as the primary complaint. Denies sthortness of breath/wheezing, otalgia, facial pain, abdominal pain, changes in bowel or bladder.  Generally feels terrible  PMH, PHS, Allergies, Problem List, Medications, Family History, and Social History have all been reviewed.  Review of Systems: as above, eating and drinking - tolerating PO. Urinating normally. No excessive vomitting or diarrhea. O/w as above.  Physical Exam:  Filed Vitals:   11/30/11 1037  BP: 100/60  Pulse: 96  Temp: 102.4 F (39.1 C)  TempSrc: Oral  Height: 5\' 8"  (1.727 m)  Weight: 175 lb 12.8 oz (79.742 kg)  SpO2: 100%    Gen: WDWN, NAD; A & O x3, cooperative. Pleasant.Globally Non-toxic HEENT: Normocephalic and atraumatic. Throat clear, w/o exudate, R TM clear, L TM - good landmarks, No fluid present. rhinnorhea. No frontal or maxillary sinus T. MMM NECK: Anterior cervical  LAD is absent CV: RRR, No M/G/R, cap refill <2 sec PULM: Breathing comfortably in no respiratory distress. no wheezing, crackles, rhonchi ABD: S,NT,ND,+BS. No HSM. No rebound. EXT: No c/c/e PSYCH: Friendly, good eye contact MSK: Nml gait  Assessment and Plan: 1. Influenza: The patient's clinical exam and history is consistent with a diagnosis of influenza.  Tamiflu, no smoking, hycodan Supportive care. Fluids. Cough medicines as needed  Anti-pyretics.  Infection control emphasized, including OOW or school until AF 24 hours.

## 2011-12-01 ENCOUNTER — Ambulatory Visit: Payer: BC Managed Care – PPO | Admitting: Family Medicine

## 2012-01-05 ENCOUNTER — Other Ambulatory Visit: Payer: Self-pay | Admitting: Family Medicine

## 2012-01-05 NOTE — Telephone Encounter (Signed)
Please call in

## 2012-01-05 NOTE — Telephone Encounter (Signed)
Medication phoned to pharmacy.  

## 2012-01-05 NOTE — Telephone Encounter (Signed)
Electronic refill request

## 2012-01-22 ENCOUNTER — Ambulatory Visit: Payer: Self-pay | Admitting: Urology

## 2012-04-07 ENCOUNTER — Other Ambulatory Visit: Payer: Self-pay | Admitting: Family Medicine

## 2012-04-08 ENCOUNTER — Other Ambulatory Visit: Payer: Self-pay | Admitting: Family Medicine

## 2012-04-08 NOTE — Telephone Encounter (Signed)
Electronic refill request

## 2012-04-11 NOTE — Telephone Encounter (Signed)
Medication phoned to pharmacy.  

## 2012-04-11 NOTE — Telephone Encounter (Signed)
Sent!

## 2012-04-11 NOTE — Telephone Encounter (Signed)
Please call in

## 2012-06-03 ENCOUNTER — Ambulatory Visit: Payer: Self-pay | Admitting: Urology

## 2012-06-22 ENCOUNTER — Ambulatory Visit: Payer: Self-pay | Admitting: Pain Medicine

## 2012-06-23 ENCOUNTER — Ambulatory Visit: Payer: Self-pay | Admitting: Pain Medicine

## 2012-07-18 ENCOUNTER — Ambulatory Visit: Payer: Self-pay | Admitting: Pain Medicine

## 2012-11-14 ENCOUNTER — Other Ambulatory Visit: Payer: Self-pay | Admitting: Family Medicine

## 2012-11-14 NOTE — Telephone Encounter (Signed)
Electronic refill request.  Please advise. 

## 2012-11-15 NOTE — Telephone Encounter (Signed)
Rx phoned to pharmacy.  

## 2012-11-15 NOTE — Telephone Encounter (Signed)
Please call in 15.  Hasn't been seen recently.  Needs to be seen.

## 2012-11-16 ENCOUNTER — Telehealth: Payer: Self-pay

## 2012-11-16 NOTE — Telephone Encounter (Signed)
Spoke with patient and he states he may have a abcess on his penis and in his genital area, per pt this started 4 days ago and it was oozing and now has a scab over it. Pt got off work early today and thought maybe he could be squeezed in, pt has appt scheduled for tomorrow at 4:00. Please advise

## 2012-11-16 NOTE — Telephone Encounter (Signed)
Spoke with patient and he will keep his appt tomorrow at 4:00, per pt it's painful but not unbearable, he will try putting guaze and neosporin on the area to keep it from rubbing.

## 2012-11-16 NOTE — Telephone Encounter (Signed)
Pt left message at front desk that has personal issue (had very sore area that pt request appt today).Left v/m for pt to call back.

## 2012-11-17 ENCOUNTER — Encounter: Payer: Self-pay | Admitting: Family Medicine

## 2012-11-17 ENCOUNTER — Ambulatory Visit (INDEPENDENT_AMBULATORY_CARE_PROVIDER_SITE_OTHER): Payer: BC Managed Care – PPO | Admitting: Family Medicine

## 2012-11-17 VITALS — BP 146/94 | HR 81 | Temp 98.6°F | Wt 185.0 lb

## 2012-11-17 DIAGNOSIS — S30825A Blister (nonthermal) of unspecified external genital organs, male, initial encounter: Secondary | ICD-10-CM

## 2012-11-17 DIAGNOSIS — N489 Disorder of penis, unspecified: Secondary | ICD-10-CM | POA: Insufficient documentation

## 2012-11-17 DIAGNOSIS — E7800 Pure hypercholesterolemia, unspecified: Secondary | ICD-10-CM

## 2012-11-17 DIAGNOSIS — S2092XA Blister (nonthermal) of unspecified parts of thorax, initial encounter: Secondary | ICD-10-CM

## 2012-11-17 DIAGNOSIS — N4889 Other specified disorders of penis: Secondary | ICD-10-CM

## 2012-11-17 DIAGNOSIS — F329 Major depressive disorder, single episode, unspecified: Secondary | ICD-10-CM

## 2012-11-17 MED ORDER — SIMVASTATIN 40 MG PO TABS: 40.0000 mg | ORAL_TABLET | Freq: Every day | ORAL | Status: DC

## 2012-11-17 MED ORDER — VALACYCLOVIR HCL 1 G PO TABS: 1000.0000 mg | ORAL_TABLET | Freq: Two times a day (BID) | ORAL | Status: DC

## 2012-11-17 NOTE — Assessment & Plan Note (Signed)
Restart simvastatin

## 2012-11-17 NOTE — Progress Notes (Signed)
Sore on penis, at the distal third of the shaft.  Some local swelling.  Sore. First noted about 5 days ago.  No testicle pain.  No dysuria.  Single partner currently.     Anxiety.  He had needed less klonopin for a period of time but his stress level had increased recently (son was ill, had a concussion).  He continues of citalopram in the meantime.  Smoking ~1ppd.  Drinking etoh rarely.  No illicits.    HLD.  He wants to restart his simvastatin. Off currently.  No ADE prev.   Meds, vitals, and allergies reviewed.   ROS: See HPI.  Otherwise, noncontributory.  nad Testes bilaterally descended without nodularity, tenderness or masses. No scrotal masses or lesions. No urethral discharge.  Single tender small (<1cm) ulcerated area with clean base on the distal shaft superiorly.  Speech and affect wnl.

## 2012-11-17 NOTE — Patient Instructions (Addendum)
Start the valtrex.  We'll contact you with your lab report. I would get tested further, either here or at the health department.  No unprotected sex in meantime.  Let me know when you are running low on klonopin.  Keep taking the citalopram.

## 2012-11-17 NOTE — Assessment & Plan Note (Signed)
>  25 min spent with face to face with patient, >50% counseling and/or coordinating care.  Discussed in detail the plan.   Advised to get STD testing.  Declined today except for HSV culture, collected.  Concern for HSV, would start valtrex.  Advised pt to talk with wife should HSV be positive.  Advised safe sex.

## 2012-11-17 NOTE — Assessment & Plan Note (Signed)
And anxiety.  Continue BZD and SSRI for now.  No SI/HI.  Okay for outpatient f/u.

## 2012-11-18 ENCOUNTER — Telehealth: Payer: Self-pay

## 2012-11-18 NOTE — Telephone Encounter (Signed)
Not ready yet

## 2012-11-18 NOTE — Telephone Encounter (Signed)
Pt left v/m requesting results of recent labs when available.

## 2012-11-21 ENCOUNTER — Other Ambulatory Visit: Payer: Self-pay | Admitting: *Deleted

## 2012-11-21 LAB — HERPES SIMPLEX VIRUS CULTURE: Organism ID, Bacteria: NOT DETECTED

## 2012-11-21 MED ORDER — CLONAZEPAM 1 MG PO TABS: ORAL_TABLET | ORAL | Status: DC

## 2012-11-21 NOTE — Telephone Encounter (Signed)
Pt calls states he is running low on clonazepam, will you refill?

## 2012-11-21 NOTE — Telephone Encounter (Signed)
Rx phoned to pharmacy.  

## 2012-11-21 NOTE — Telephone Encounter (Signed)
Please call in

## 2012-12-21 ENCOUNTER — Ambulatory Visit (INDEPENDENT_AMBULATORY_CARE_PROVIDER_SITE_OTHER): Payer: BC Managed Care – PPO | Admitting: Family Medicine

## 2012-12-21 ENCOUNTER — Encounter: Payer: Self-pay | Admitting: Family Medicine

## 2012-12-21 VITALS — BP 130/74 | HR 96 | Temp 99.8°F | Wt 171.8 lb

## 2012-12-21 DIAGNOSIS — J111 Influenza due to unidentified influenza virus with other respiratory manifestations: Secondary | ICD-10-CM

## 2012-12-21 DIAGNOSIS — R6889 Other general symptoms and signs: Secondary | ICD-10-CM

## 2012-12-21 MED ORDER — OSELTAMIVIR PHOSPHATE 75 MG PO CAPS: 75.0000 mg | ORAL_CAPSULE | Freq: Two times a day (BID) | ORAL | Status: DC

## 2012-12-21 MED ORDER — ALBUTEROL SULFATE HFA 108 (90 BASE) MCG/ACT IN AERS: 2.0000 | INHALATION_SPRAY | Freq: Four times a day (QID) | RESPIRATORY_TRACT | Status: DC | PRN

## 2012-12-21 NOTE — Patient Instructions (Signed)
Start the tamiflu today and use the inhaler for the cough.  I would get a flu shot each fall.    If you get more short of breath, then go to the ER.   Cover your cough.  Take care.

## 2012-12-21 NOTE — Progress Notes (Signed)
Sx started Monday.  "I feel awful."  Positive for fever, aches (diffuse), cough, raw throat from the cough.  No ear pain.  Some diarrhea, no vomiting.  Sore from coughing.  Had a flu shot prev, but not this year.  Encouraged to get a flu shot each fall. Robitussin isn't helping the cough.   Wife tested pos for flu today here in clinic.    Meds, vitals, and allergies reviewed.   ROS: See HPI.  Otherwise, noncontributory.  GEN: nad, alert and oriented, he looks like he doesn't feel well but isn't in distress HEENT: mucous membranes moist, tm w/o erythema, nasal exam w/o erythema, clear discharge noted,  OP with cobblestoning NECK: supple w/o LA CV: rrr.   PULM: ctab, no inc wob, dry cough noted EXT: no edema SKIN: no acute rash

## 2012-12-22 DIAGNOSIS — R6889 Other general symptoms and signs: Secondary | ICD-10-CM | POA: Insufficient documentation

## 2012-12-22 NOTE — Assessment & Plan Note (Signed)
Wife pos.  Advised to stop smoking, get flu shot yearly.  Presumed, start tamiflu.  F/u prn.  Nontoxic.  He used SABA here in clinic.  Recheck ctab and dec in in cough.  Use SABA prn cough.

## 2012-12-30 ENCOUNTER — Other Ambulatory Visit: Payer: Self-pay | Admitting: Family Medicine

## 2012-12-30 NOTE — Telephone Encounter (Signed)
Electronic refill request.  Please advise. 

## 2012-12-30 NOTE — Telephone Encounter (Signed)
Rx phoned to pharmacy.  

## 2012-12-30 NOTE — Telephone Encounter (Signed)
Please call in

## 2013-02-06 ENCOUNTER — Other Ambulatory Visit: Payer: Self-pay | Admitting: Family Medicine

## 2013-02-06 NOTE — Telephone Encounter (Signed)
Received refill request electronically. Last office visit 4//2/14. Is it okay to refill medication?

## 2013-02-06 NOTE — Telephone Encounter (Signed)
Okay to fill, please call in.  

## 2013-02-07 NOTE — Telephone Encounter (Signed)
Medication phoned to pharmacy.  

## 2013-04-07 ENCOUNTER — Other Ambulatory Visit: Payer: Self-pay | Admitting: Family Medicine

## 2013-04-07 NOTE — Telephone Encounter (Signed)
Electronic refill request.  Please advise. 

## 2013-04-09 NOTE — Telephone Encounter (Signed)
Please call in

## 2013-04-10 NOTE — Telephone Encounter (Signed)
Medication phoned to pharmacy.  

## 2013-05-11 ENCOUNTER — Ambulatory Visit (INDEPENDENT_AMBULATORY_CARE_PROVIDER_SITE_OTHER): Payer: BC Managed Care – PPO | Admitting: Family Medicine

## 2013-05-11 ENCOUNTER — Encounter: Payer: Self-pay | Admitting: Family Medicine

## 2013-05-11 ENCOUNTER — Ambulatory Visit (INDEPENDENT_AMBULATORY_CARE_PROVIDER_SITE_OTHER)
Admission: RE | Admit: 2013-05-11 | Discharge: 2013-05-11 | Disposition: A | Discharge status: Home / Self Care | Payer: BC Managed Care – PPO | Source: Ambulatory Visit | Attending: Family Medicine | Admitting: Family Medicine

## 2013-05-11 VITALS — BP 144/88 | HR 84 | Temp 98.5°F | Wt 181.0 lb

## 2013-05-11 DIAGNOSIS — K469 Unspecified abdominal hernia without obstruction or gangrene: Secondary | ICD-10-CM

## 2013-05-11 DIAGNOSIS — N200 Calculus of kidney: Secondary | ICD-10-CM

## 2013-05-11 DIAGNOSIS — N529 Male erectile dysfunction, unspecified: Secondary | ICD-10-CM

## 2013-05-11 DIAGNOSIS — M25579 Pain in unspecified ankle and joints of unspecified foot: Secondary | ICD-10-CM

## 2013-05-11 MED ORDER — SILDENAFIL CITRATE 100 MG PO TABS: 50.0000 mg | ORAL_TABLET | Freq: Every day | ORAL | Status: DC | PRN

## 2013-05-11 NOTE — Progress Notes (Signed)
2 recent stones passed (#20 and #21 lifetime).  He caught one of them but didn't bring it in.  BP was elevated in the meantime on home checks.  He has some R MTP swelling and pain recently, better today.  He could still walk but with a lot of pain.  He had similar pain in the MCPs prev.  He didn't know about uric acid level testing.  Still with frequent L flank pain.  He hasn't had uro f/u for this episode.    ED noted by patient. He can get an erection but can't climax before losing the erection.  Still smoking.  Discussed.  He has an E cig kit and is considering this.    Meds, vitals, and allergies reviewed.   ROS: See HPI.  Otherwise, noncontributory.  GEN: nad, alert and oriented HEENT: mucous membranes moist NECK: supple w/o LA CV: rrr PULM: ctab, no inc wob ABD: soft, +bs EXT: no edema SKIN: no acute rash

## 2013-05-11 NOTE — Patient Instructions (Addendum)
Go to the lab on the way out.  We'll contact you with your lab and xray report. Price check the viagra.  Take care.  Use the E cig in the meantime.

## 2013-05-12 DIAGNOSIS — M25579 Pain in unspecified ankle and joints of unspecified foot: Secondary | ICD-10-CM | POA: Insufficient documentation

## 2013-05-12 DIAGNOSIS — N529 Male erectile dysfunction, unspecified: Secondary | ICD-10-CM | POA: Insufficient documentation

## 2013-05-12 LAB — COMPREHENSIVE METABOLIC PANEL
AST: 21 U/L (ref 0–37)
BUN: 11 mg/dL (ref 6–23)
Calcium: 9.2 mg/dL (ref 8.4–10.5)
Chloride: 107 mEq/L (ref 96–112)
Creatinine, Ser: 0.8 mg/dL (ref 0.4–1.5)
GFR: 107.35 mL/min (ref >60.00)
Glucose, Bld: 101 mg/dL — ABNORMAL HIGH (ref 70–99)

## 2013-05-12 LAB — URIC ACID: Uric Acid, Serum: 5.8 mg/dL (ref 4.0–7.8)

## 2013-05-12 NOTE — Assessment & Plan Note (Signed)
Check KUB and labs today. See notes on labs.

## 2013-05-12 NOTE — Assessment & Plan Note (Signed)
Needs to stop smoking.  rx given for viagra with routine cautions.

## 2013-05-12 NOTE — Assessment & Plan Note (Signed)
Normal foot inspection now, could have been gout, see notes on labs.  Gout diet given to patient.

## 2013-07-12 ENCOUNTER — Other Ambulatory Visit: Payer: Self-pay | Admitting: Family Medicine

## 2013-07-12 NOTE — Telephone Encounter (Signed)
Please call in

## 2013-07-12 NOTE — Telephone Encounter (Signed)
[facility] left v/m requesting refill clonazepam.Please advise.

## 2013-07-12 NOTE — Telephone Encounter (Signed)
Received refill request electronically. Last office visit 05/11/13. Is it okay to refill medication?

## 2013-07-13 NOTE — Telephone Encounter (Signed)
Medication phoned to pharmacy.  

## 2013-07-24 ENCOUNTER — Ambulatory Visit (INDEPENDENT_AMBULATORY_CARE_PROVIDER_SITE_OTHER): Payer: BC Managed Care – PPO | Admitting: General Surgery

## 2013-07-24 ENCOUNTER — Encounter: Payer: Self-pay | Admitting: General Surgery

## 2013-07-24 VITALS — BP 150/90 | HR 78 | Resp 12 | Ht 69.0 in | Wt 182.0 lb

## 2013-07-24 DIAGNOSIS — D179 Benign lipomatous neoplasm, unspecified: Secondary | ICD-10-CM | POA: Insufficient documentation

## 2013-07-24 NOTE — Patient Instructions (Signed)
Patient to return as needed. 

## 2013-07-24 NOTE — Progress Notes (Signed)
Patient ID: Eric Wells, male   DOB: [birth year], 42 y.o.   MRN: [id number]  Chief Complaint  Patient presents with  . Other    abdominal hernia    HPI Eric Wells is a 42 y.o. male here today for a evalaution of abdominal wall hernia. Patient states he noticed this about 5 years ago.Marland KitchenHe state it has enlarged slightly in that time. No pain or tenderness. HPI  Past Medical History  Diagnosis Date  . Depression 04/2008    Hosp., suicide risk  . Nephrolithiasis     Dr. Vonita Moss with Uro  . Hematuria 2011    Eval with Uro  . Femur fracture 1991    Left  . Tendon laceration 1993    Tricep    Past Surgical History  Procedure Laterality Date  . Kidney stone surgery  2000  . Femur surgery  1990  . Triceps tendon repair  1993  . Lithotripsy  2011    Family History  Problem Relation Age of Onset  . Heart disease Father     MI  . Heart disease Maternal Grandmother     CHF  . Cancer Maternal Grandfather     Colon CA  . Cancer Paternal Grandmother     Breast CA  . Cancer Paternal Grandfather     Lung CA    Social History History  Substance Use Topics  . Smoking status: Current Some Day Smoker -- 1.00 packs/day for 20 years    Types: Cigarettes  . Smokeless tobacco: Never Used  . Alcohol Use: Yes     Comment: Rare    Allergies  Allergen Reactions  . Hydromorphone Hcl     REACTION: Itch, rash  . Varenicline Tartrate     REACTION: abnormal dreams    Current Outpatient Prescriptions  Medication Sig Dispense Refill  . citalopram (CELEXA) 20 MG tablet TAKE 1 TABLET BY MOUTH EVERY DAY  90 tablet  1  . clonazePAM (KLONOPIN) 1 MG tablet TAKE 1/2 TO 1 TABLET BY MOUTH THREE TIMES DAILY AS NEEDED FOR ANXIETY  30 tablet  1  . simvastatin (ZOCOR) 40 MG tablet Take 1 tablet (40 mg total) by mouth at bedtime.  30 tablet  12   No current facility-administered medications for this visit.    Review of Systems Review of Systems  Constitutional: Negative.   Respiratory:  Negative.   Cardiovascular: Negative.     Blood pressure 150/90, pulse 78, resp. rate 12, height 5\' 9"  (1.753 m), weight 182 lb (82.555 kg).  Physical Exam Physical Exam  Constitutional: He is oriented to person, place, and time. He appears well-developed and well-nourished.  Eyes: No scleral icterus.  Cardiovascular: Normal rate, regular rhythm and normal heart sounds.   Pulmonary/Chest: He has rhonchi.  Abdominal: Soft. Normal appearance and bowel sounds are normal. There is no tenderness. No hernia.    Lymphadenopathy:    He has no cervical adenopathy.  Neurological: He is alert and oriented to person, place, and time.  Skin: Skin is warm and dry.    Data Reviewed Noncontrast CT for stone disease dated 06/03/2012 was reviewed. There is a dense soft tissue area underlying the dermis corresponding to the palpable mass. (Not commented upon by the radiologist) This measures less than 1 cm in diameter. Reported umbilical hernia with preperitoneal fat.  Assessment    Soft tissue lipoma.    Plan    By report there is minimal larger than the last 4-to 5 years. The area is  otherwise asymptomatic. Excision is recommended for: 1) significant enlargement (doubling in size); 2) local discomfort or 3) concern over existence of the lesion.  Unless the area significantly changes, becomes tender where he becomes concern, observation alone would be warranted.       Earline Mayotte 07/24/2013, 9:23 AM

## 2013-07-31 ENCOUNTER — Ambulatory Visit: Payer: Self-pay

## 2013-08-21 ENCOUNTER — Ambulatory Visit: Payer: BC Managed Care – PPO | Admitting: Family Medicine

## 2013-09-05 ENCOUNTER — Ambulatory Visit: Payer: Self-pay | Admitting: Neurology

## 2013-09-18 IMAGING — CT CT ABDOMEN AND PELVIS WITHOUT AND WITH CONTRAST
2 of 4 series · 14 of 32 positions shown, 19 images · non-contrast
Comparison: none

REASON FOR EXAM: rt flank pain
COMMENTS:

[Series 2: abd 3mm wo 3.0 i40f 3 · axial · 0.84mm/px · z∈[-436,-46]mm · 8 of 168 slices shown, 13 images]
[im 19/168  soft-tissue]
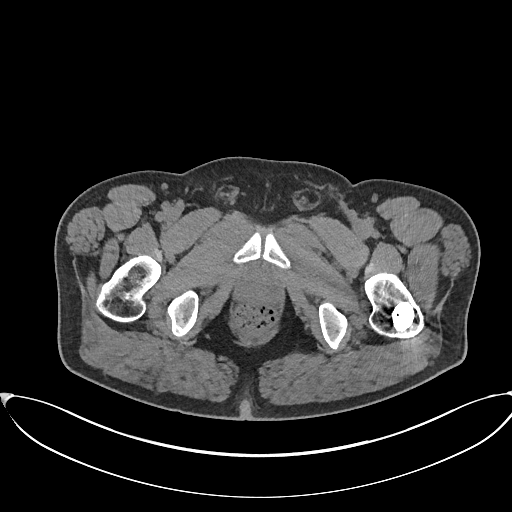
[im 19/168  bone]
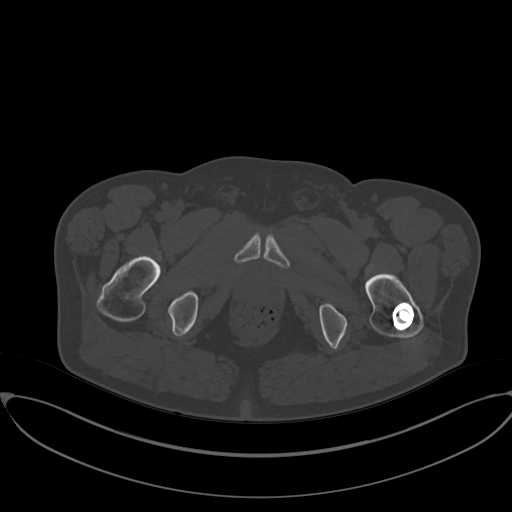
[im 38/168  soft-tissue]
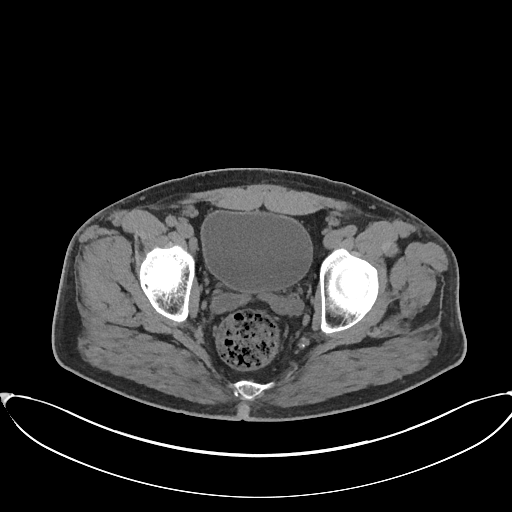
[im 56/168  soft-tissue]
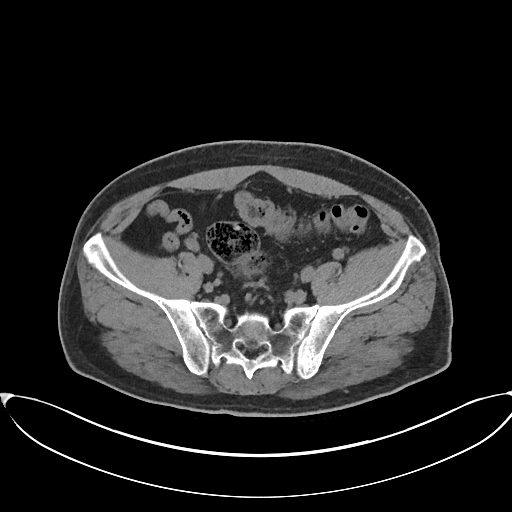
[im 75/168  soft-tissue]
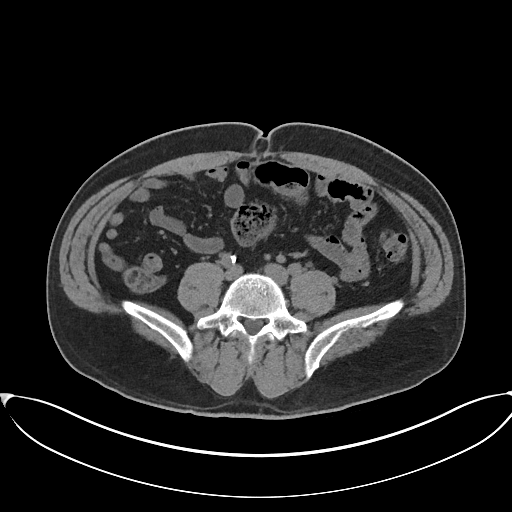
[im 93/168  soft-tissue]
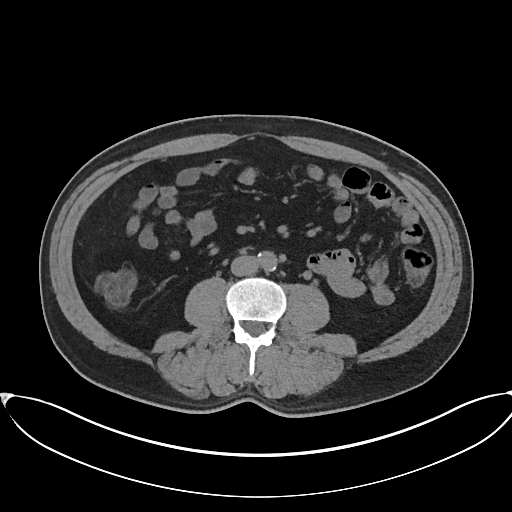
[im 93/168  lung]
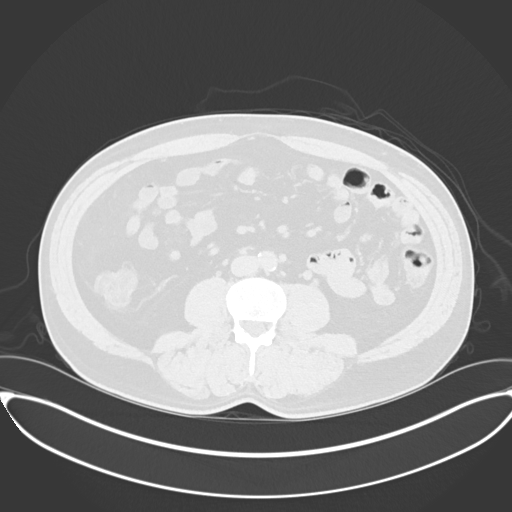
[im 112/168  soft-tissue]
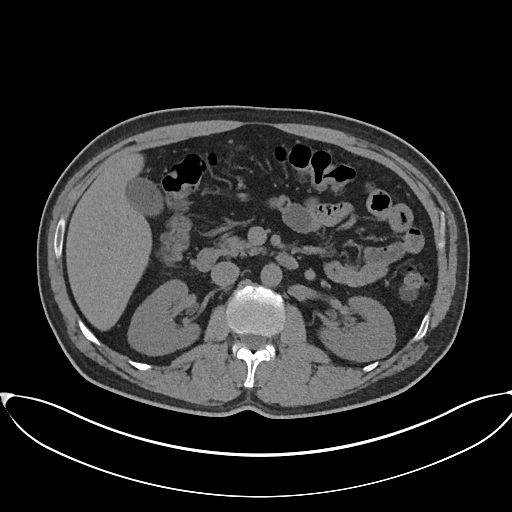
[im 112/168  lung]
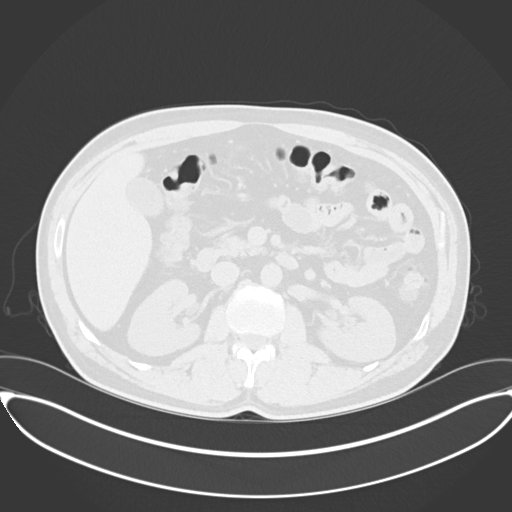
[im 130/168  soft-tissue]
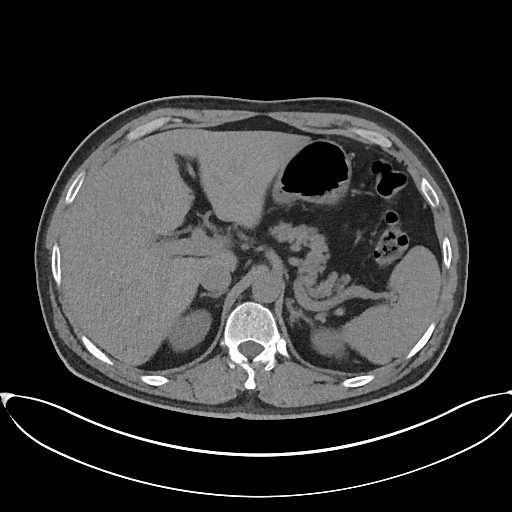
[im 130/168  lung]
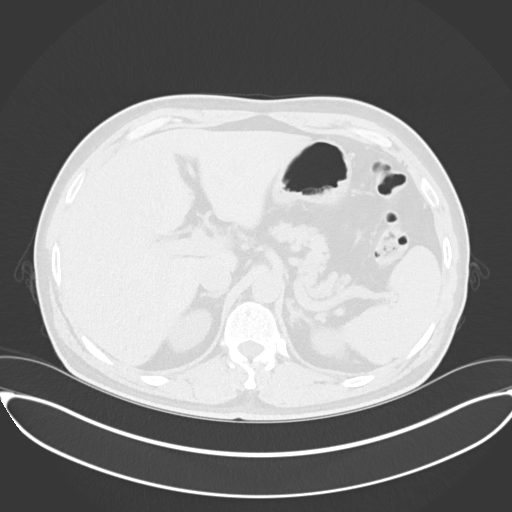
[im 149/168  soft-tissue]
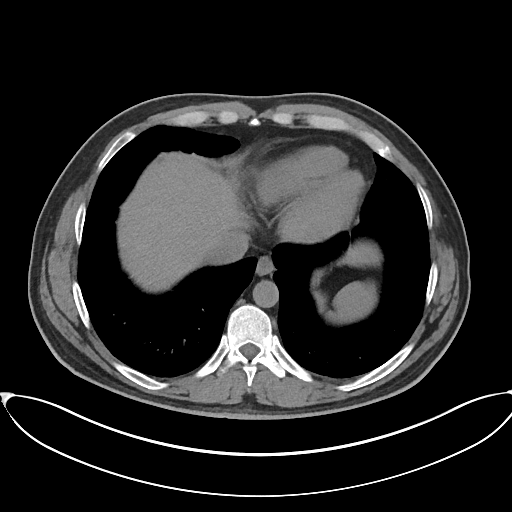
[im 149/168  lung]
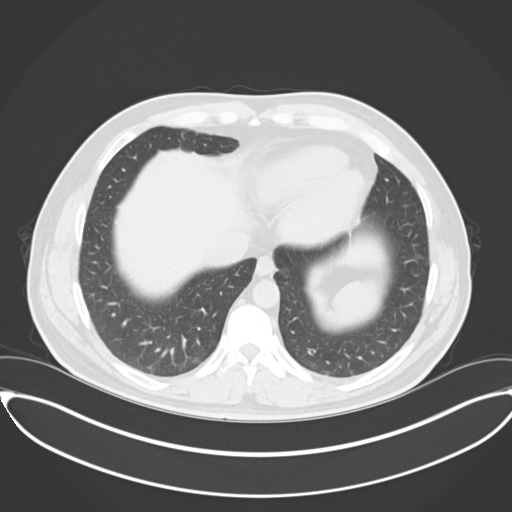

[Series 4: abd 3mm w 3.0 i40f 3 · axial · 0.84mm/px · z∈[-436,-157]mm · 6 of 168 slices shown]
[im 19/168  soft-tissue]
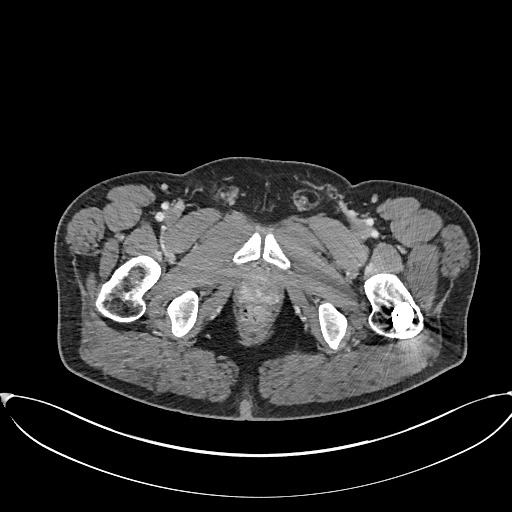
[im 38/168  soft-tissue]
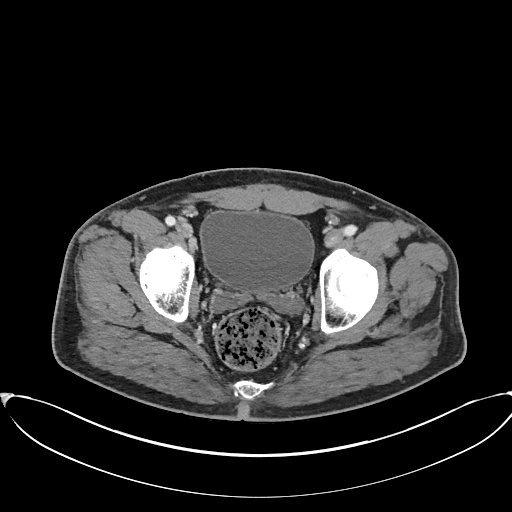
[im 56/168  soft-tissue]
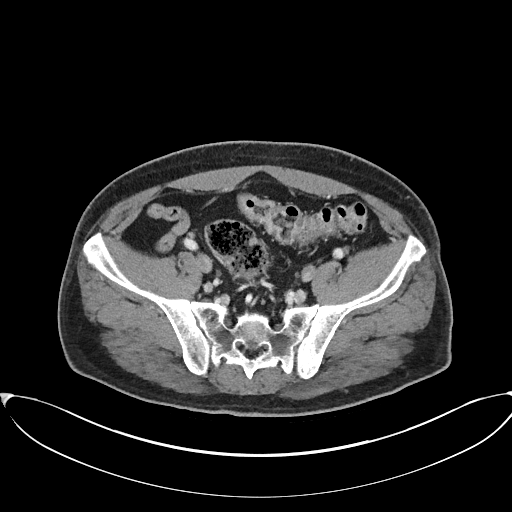
[im 75/168  soft-tissue]
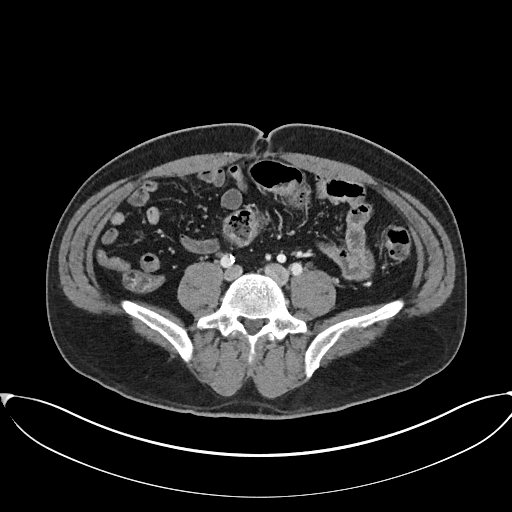
[im 93/168  soft-tissue]
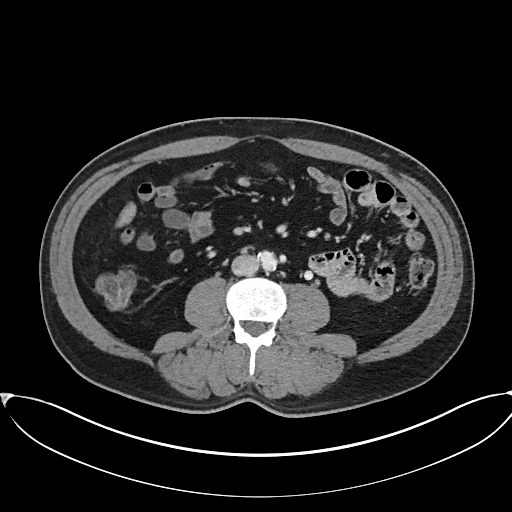
[im 112/168  soft-tissue]
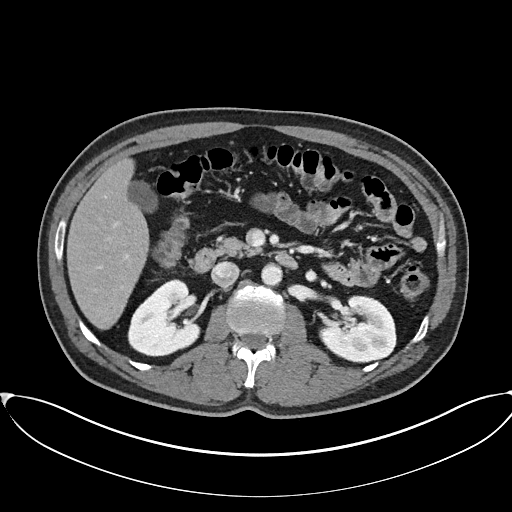

[14 of 32 positions shown; findings below may reference images not displayed]

PROCEDURE:     KCT - KCT ABDOMEN/PELVIS W/WO  - January 22, 2012  [DATE]

RESULT:     A triphasic CT scan was performed through the abdomen and pelvis
with reconstructions at 3 mm intervals and slice thicknesses. For the
postcontrast images the patient received 100 cc of Msovue-PTW. Comparison is
made to a noncontrast study July 06, 2011.

On the noncontrast images the kidneys are normal in contour. There is a
nonobstructing stone measuring approximately 3 mm in diameter in a midpole
calyx on the right. The perinephric fat is normal in density. Following
contrast administration the enhancement pattern of the renal parenchyma is
normal. On delayed images the parenchymal enhancement pattern remains within
the limits of normal. There may be a tiny subcentimeter cyst in the medial
aspect of the midpole of the left kidney. This is too small for accurate
Hounsfield measurement. The ureters are fractionally visualized. The
partially distended urinary bladder is normal in appearance. There is a
retroaortic left renal vein.

The caliber of the abdominal aorta is normal. The liver, gallbladder,
spleen, nondistended stomach, pancreas, adrenal glands, an small and large
bowel exhibit no acute abnormality. A normal-appearing appendix is
demonstrated. The prostate gland and seminal vesicles exhibit no acute
abnormality.

The lung bases are clear. The lumbar vertebral bodies are preserved in
height.
IMPRESSION: 1. There is a nonobstructing 3 mm diameter midpole stone in the right
kidney. I see no stones elsewhere. The perinephric fat is normal in density.
The ureters, urinary bladder, prostate gland, and seminal vesicles are
normal in appearance.
2. I see no acute hepatobiliary abnormality nor acute bowel abnormality.

[REDACTED]

## 2013-09-21 HISTORY — PX: CERVICAL SPINE SURGERY: SHX589

## 2013-10-07 ENCOUNTER — Emergency Department: Payer: Self-pay | Admitting: Emergency Medicine

## 2013-10-19 ENCOUNTER — Other Ambulatory Visit: Payer: Self-pay | Admitting: Family Medicine

## 2013-10-19 NOTE — Telephone Encounter (Signed)
Electronic refill request.  Please advise. 

## 2013-10-19 NOTE — Telephone Encounter (Signed)
Please call in

## 2013-10-20 NOTE — Telephone Encounter (Signed)
Medication phoned to pharmacy.  

## 2014-02-08 ENCOUNTER — Ambulatory Visit (INDEPENDENT_AMBULATORY_CARE_PROVIDER_SITE_OTHER): Payer: BC Managed Care – PPO | Admitting: Family Medicine

## 2014-02-08 ENCOUNTER — Encounter: Payer: Self-pay | Admitting: Family Medicine

## 2014-02-08 VITALS — BP 142/88 | HR 77 | Temp 98.3°F | Wt 187.2 lb

## 2014-02-08 DIAGNOSIS — I10 Essential (primary) hypertension: Secondary | ICD-10-CM | POA: Insufficient documentation

## 2014-02-08 MED ORDER — HYDROCHLOROTHIAZIDE 12.5 MG PO TABS: 12.5000 mg | ORAL_TABLET | Freq: Every day | ORAL | Status: DC

## 2014-02-08 NOTE — Progress Notes (Signed)
Pre visit review using our clinic review tool, if applicable. No additional management support is needed unless otherwise documented below in the visit note.  His BP had been elevated.  Up to ~180/110 per patient.    He has f/u surgery pending for neck surgery.  More neck and L arm pain in the meantime.    He has almost quit smoking.  Using a vapor substitute.    Meds, vitals, and allergies reviewed.   ROS: See HPI.  Otherwise, noncontributory.  GEN: nad, alert and oriented HEENT: mucous membranes moist NECK: supple w/o LA CV: rrr. PULM: ctab, no inc wob ABD: soft, +bs EXT: no edema SKIN: no acute rash

## 2014-02-08 NOTE — Assessment & Plan Note (Signed)
Mildly elevated today, add on HCTZ, cut salt from diet.  See notes on labs.

## 2014-02-08 NOTE — Patient Instructions (Signed)
Start taking HCTZ 12.5mg  a day.  Cut out as much salt as possible and get off nicotine gradually.  Go to the lab on the way out.  We'll contact you with your lab report. Update me in about 1 week with your BP readings, if not consistently <140/<90.

## 2014-02-09 ENCOUNTER — Encounter: Payer: Self-pay | Admitting: *Deleted

## 2014-02-09 LAB — BASIC METABOLIC PANEL
BUN: 7 mg/dL (ref 6–23)
CALCIUM: 9 mg/dL (ref 8.4–10.5)
CO2: 30 mEq/L (ref 19–32)
CREATININE: 0.9 mg/dL (ref 0.4–1.5)
Chloride: 103 mEq/L (ref 96–112)
GFR: 102.68 mL/min (ref >60.00)
Glucose, Bld: 97 mg/dL (ref 70–99)
Potassium: 4.2 mEq/L (ref 3.5–5.1)
Sodium: 139 mEq/L (ref 135–145)

## 2014-02-19 ENCOUNTER — Other Ambulatory Visit: Payer: Self-pay | Admitting: Family Medicine

## 2014-02-20 NOTE — Telephone Encounter (Signed)
please call in

## 2014-02-20 NOTE — Telephone Encounter (Signed)
Received refill request electronically. Last refill 10/19/13 #30/1 refill, last office visit 02/08/14. Is it okay to refill medication?

## 2014-02-21 NOTE — Telephone Encounter (Signed)
Phoned in to pharmacy. 

## 2014-02-22 ENCOUNTER — Encounter: Payer: Self-pay | Admitting: Family Medicine

## 2014-05-27 ENCOUNTER — Other Ambulatory Visit: Payer: Self-pay | Admitting: Family Medicine

## 2014-05-29 NOTE — Telephone Encounter (Signed)
Last filled 05/11/13

## 2014-05-30 NOTE — Telephone Encounter (Signed)
Sent. Thanks.   

## 2014-06-06 ENCOUNTER — Other Ambulatory Visit: Payer: Self-pay | Admitting: Family Medicine

## 2014-06-06 NOTE — Telephone Encounter (Signed)
Last office visit 02/08/2014.  Last refilled 02/20/2014 for #30 with 1 refill.  Ok to refill?

## 2014-06-06 NOTE — Telephone Encounter (Signed)
Please call in

## 2014-06-07 NOTE — Telephone Encounter (Signed)
Rx called to pharmacy as instructed. 

## 2014-06-25 ENCOUNTER — Ambulatory Visit: Payer: BC Managed Care – PPO | Admitting: Family Medicine

## 2014-06-25 ENCOUNTER — Encounter: Payer: Self-pay | Admitting: Family Medicine

## 2014-06-25 ENCOUNTER — Ambulatory Visit (INDEPENDENT_AMBULATORY_CARE_PROVIDER_SITE_OTHER): Payer: BC Managed Care – PPO | Admitting: Family Medicine

## 2014-06-25 VITALS — BP 144/90 | HR 104 | Temp 98.6°F | Wt 190.5 lb

## 2014-06-25 DIAGNOSIS — I10 Essential (primary) hypertension: Secondary | ICD-10-CM

## 2014-06-25 MED ORDER — LISINOPRIL 10 MG PO TABS: 10.0000 mg | ORAL_TABLET | Freq: Every day | ORAL | Status: DC

## 2014-06-25 NOTE — Progress Notes (Signed)
Pre visit review using our clinic review tool, if applicable. No additional management support is needed unless otherwise documented below in the visit note.  Pain is increased after PT for his neck, after C spine surgery.  Now with return on L arm pain.  BP was elevated at surgeon's office this AM, BP checked in B arms.  Still smoking, but he wants to quit.  He has a few cigs left and he doesn't want to buy anymore.  He is trying to substitute with a vapor device.  Off HCTZ now, has been off for a few weeks.  No BLE edema.  He decided to quit his BP medicine on his own, "since it wasn't working."   He has a BP cuff at home.  He doesn't think he is more nervous at the surgery clinic.    Recheck BP 146/98.   Meds, vitals, and allergies reviewed.   ROS: See HPI.  Otherwise, noncontributory.  GEN: nad, alert and oriented HEENT: mucous membranes moist NECK: supple w/o LA CV: rrr.  PULM: ctab, no inc wob ABD: soft, +bs EXT: no edema SKIN: no acute rash

## 2014-06-25 NOTE — Patient Instructions (Signed)
Go to the lab on the way out.  We'll contact you with your lab report. Start on lisinopril 10mg  a day.  Update me in about 1 week.  We'll go from there.  Take care.

## 2014-06-26 ENCOUNTER — Telehealth: Payer: Self-pay | Admitting: Family Medicine

## 2014-06-26 LAB — BASIC METABOLIC PANEL
BUN: 10 mg/dL (ref 6–23)
CALCIUM: 9.8 mg/dL (ref 8.4–10.5)
CO2: 29 mEq/L (ref 19–32)
Chloride: 102 mEq/L (ref 96–112)
Creatinine, Ser: 0.8 mg/dL (ref 0.4–1.5)
GFR: 120.04 mL/min (ref >60.00)
GLUCOSE: 100 mg/dL — AB (ref 70–99)
POTASSIUM: 4.1 meq/L (ref 3.5–5.1)
Sodium: 139 mEq/L (ref 135–145)

## 2014-06-26 NOTE — Telephone Encounter (Signed)
emmi mailed  °

## 2014-06-27 ENCOUNTER — Ambulatory Visit: Payer: Self-pay | Admitting: Orthopedic Surgery

## 2014-06-27 NOTE — Assessment & Plan Note (Signed)
Change to ace, report back with BP.  Needs to quit smoking, discussed.  See notes on labs.

## 2014-07-23 ENCOUNTER — Ambulatory Visit (INDEPENDENT_AMBULATORY_CARE_PROVIDER_SITE_OTHER): Payer: BC Managed Care – PPO | Admitting: Internal Medicine

## 2014-07-23 ENCOUNTER — Encounter: Payer: Self-pay | Admitting: Internal Medicine

## 2014-07-23 VITALS — BP 120/68 | HR 102 | Temp 98.0°F | Wt 189.0 lb

## 2014-07-23 DIAGNOSIS — Z720 Tobacco use: Secondary | ICD-10-CM

## 2014-07-23 DIAGNOSIS — F17200 Nicotine dependence, unspecified, uncomplicated: Secondary | ICD-10-CM

## 2014-07-23 DIAGNOSIS — J441 Chronic obstructive pulmonary disease with (acute) exacerbation: Secondary | ICD-10-CM

## 2014-07-23 MED ORDER — AZITHROMYCIN 250 MG PO TABS: ORAL_TABLET | ORAL | Status: DC

## 2014-07-23 MED ORDER — HYDROCODONE-HOMATROPINE 5-1.5 MG/5ML PO SYRP: 5.0000 mL | ORAL_SOLUTION | Freq: Three times a day (TID) | ORAL | Status: DC | PRN

## 2014-07-23 MED ORDER — PREDNISONE 10 MG PO TABS: ORAL_TABLET | ORAL | Status: DC

## 2014-07-23 NOTE — Progress Notes (Signed)
HPI  Eric Wells presents to the clinic today with c/o cough, chest congestion, right side ear pain and just "feels bad". He reports this started 1 day ago. The cough is productive of thick white/pale green mucous. He reports this started after he went to a football game 2 days ago. He has tried Mucinex and Aleve without much relief. He has also taken 2 doses of leftover amoxicillin that his wife had. He has no history of seasonal allergies. He does have COPD (he continues to smoke). He has not had sick contacts that he is aware of.   Review of Systems      Past Medical History  Diagnosis Date  . Depression 04/2008    Hosp., suicide risk  . Nephrolithiasis     Dr. Terance Hart with Uro  . Hematuria 2011    Eval with Uro  . Femur fracture 1991    Left  . Tendon laceration 1993    Tricep    Family History  Problem Relation Age of Onset  . Heart disease Father     MI  . Heart disease Maternal Grandmother     CHF  . Cancer Maternal Grandfather     Colon CA  . Cancer Paternal Grandmother     Breast CA  . Cancer Paternal Grandfather     Lung CA    History   Social History  . Marital Status: Married    Spouse Name: N/A    Number of Children: 2  . Years of Education: N/A   Occupational History  . Unemployed currently    Social History Main Topics  . Smoking status: Current Some Day Smoker -- 1.00 packs/day for 20 years    Types: Cigarettes  . Smokeless tobacco: Never Used  . Alcohol Use: Yes     Comment: Rare  . Drug Use: No  . Sexual Activity: Not on file   Other Topics Concern  . Not on file   Social History Narrative   Diet:  Fast food, some fried foods, mainly grilled.   Exercise:  Disc golf, golf, hunting, fishing    Allergies  Allergen Reactions  . Hydromorphone Hcl     REACTION: Itch, rash  . Varenicline Tartrate     REACTION: abnormal dreams     Constitutional: Positive headache. Denies fatigue, fever or abrupt weight changes.  HEENT:  Positive sore throat.  Denies eye redness, eye pain, pressure behind the eyes, facial pain, nasal congestion, ear pain, ringing in the ears, wax buildup, runny nose or bloody nose. Respiratory: Positive cough and chest congestion. Denies difficulty breathing or shortness of breath.  Cardiovascular: Denies chest pain, chest tightness, palpitations or swelling in the hands or feet.   No other specific complaints in a complete review of systems (except as listed in HPI above).  Objective:   BP 120/68 mmHg  Pulse 102  Temp(Src) 98 F (36.7 C) (Oral)  Wt 189 lb (85.73 kg)  SpO2 98% Wt Readings from Last 3 Encounters:  07/23/14 189 lb (85.73 kg)  06/25/14 190 lb 8 oz (86.41 kg)  02/08/14 187 lb 4 oz (84.936 kg)     General: Appears his stated age, ill appearing in NAD. HEENT: Head: normal shape and size, mild frontal sinus tenderness; Eyes: sclera injected, no icterus, conjunctiva pink, PERRLA and EOMs intact; Ears: Tm's gray and intact, normal light reflex; Nose: mucosa pink and moist, septum midline; Throat/Mouth: Teeth present, mucosa erythematous and moist, no exudate noted, no lesions or ulcerations noted.  Cardiovascular: Tachycardic with normal rhythm. S1,S2 noted.  No murmur, rubs or gallops noted.  Pulmonary/Chest: Normal effort and coarse breath sounds with bilateral expiratory wheeze noted. No respiratory distress. No rales or ronchi noted.      Assessment & Plan:  COPD exacerbation:  Advised him to stop smoking- bad reaction to chantix in the past. He is trying to wean down. Get some rest and drink plenty of water Do salt water gargles for the sore throat eRx for Azithromax x 5 days eRx for Pred Taper x 6 days Rx for Hycodan cough syrup  RTC as needed or if symptoms persist.

## 2014-07-23 NOTE — Progress Notes (Signed)
Subjective:    Patient ID: Schneider Warchol, male    DOB: 06/28/1971, 43 y.o.   MRN: [id number]  HPI Pt is a 43 yo male that presents with cough, chest tightness and right side ear pain and "feels terrible".  Pt has a PMH of COPD and continues to smoke 1/2 ppd.  Pt has no sick contacts but did experience similar symptoms 1 month ago.  Pt went to football game x2 days ago, had scratchy throat afterwards but started feeling worse the next morning.  Pt woke up with chest tightness, cough and aches.  Pt is producing "light green" colored mucous when coughing.  Pt has tried [employer], Muconex and leftover Amoxil (2 days worth) with little relief.  Cough is made worse when breathing deeply. Pt experiencing sore throat and headache which he contributes to the cough.  Pt denies chest pain or N/V.   Review of Systems  Past Medical History  Diagnosis Date  . Depression 04/2008    Hosp., suicide risk  . Nephrolithiasis     Dr. Terance Hart with Uro  . Hematuria 2011    Eval with Uro  . Femur fracture 1991    Left  . Tendon laceration 1993    Tricep    Current Outpatient Prescriptions  Medication Sig Dispense Refill  . clonazePAM (KLONOPIN) 1 MG tablet TAKE ONE-HALF TO ONE TABLET BY MOUTH THREE TIMES DAILY AS NEEDED FOR ANXIETY 30 tablet 1  . lisinopril (PRINIVIL) 10 MG tablet Take 1 tablet (10 mg total) by mouth daily. 90 tablet 3  . methocarbamol (ROBAXIN) 750 MG tablet Take 750 mg by mouth 4 (four) times daily.    Marland Kitchen oxyCODONE-acetaminophen (PERCOCET/ROXICET) 5-325 MG per tablet Take 1 tablet by mouth every 12 (twelve) hours.    . simvastatin (ZOCOR) 40 MG tablet Take 1 tablet (40 mg total) by mouth at bedtime. 30 tablet 12  . VIAGRA 100 MG tablet TAKE 1/2 TO 1 TABLET BY MOUTH EVERY DAY AS NEEDED FOR ERECTILE DYSFUNCTION 5 tablet 5  . azithromycin (ZITHROMAX) 250 MG tablet Take 2 tabs today, then 1 tab daily x 4 days 6 tablet 0  . HYDROcodone-homatropine (HYCODAN) 5-1.5 MG/5ML syrup Take 5 mLs by mouth  every 8 (eight) hours as needed for cough. 120 mL 0  . predniSONE (DELTASONE) 10 MG tablet Take 3 tabs on days 1-2, take 2 tabs on days 3-4, take 1 tab on days 5-6 12 tablet 0   No current facility-administered medications for this visit.    Allergies  Allergen Reactions  . Hydromorphone Hcl     REACTION: Itch, rash  . Varenicline Tartrate     REACTION: abnormal dreams    Family History  Problem Relation Age of Onset  . Heart disease Father     MI  . Heart disease Maternal Grandmother     CHF  . Cancer Maternal Grandfather     Colon CA  . Cancer Paternal Grandmother     Breast CA  . Cancer Paternal Grandfather     Lung CA    History   Social History  . Marital Status: Married    Spouse Name: N/A    Number of Children: 2  . Years of Education: N/A   Occupational History  . Unemployed currently    Social History Main Topics  . Smoking status: Current Some Day Smoker -- 1.00 packs/day for 20 years    Types: Cigarettes  . Smokeless tobacco: Never Used  . Alcohol Use: Yes  Comment: Rare  . Drug Use: No  . Sexual Activity: Not on file   Other Topics Concern  . Not on file   Social History Narrative   Diet:  Fast food, some fried foods, mainly grilled.   Exercise:  Disc golf, golf, hunting, fishing     Constitutional: Positive Malaise, fatigue, headache Denies abrupt weight changes.  HEENT: Positive Ear pain, nasal congestion, sore throat. Denies eye pain, eye redness, ringing in the ears, wax buildup, bloody nose. Respiratory: Positive difficulty breathing, shortness of breath, cough or sputum production.   Cardiovascular: Positive chest tightness. Denies chest pain, palpitations or swelling in the hands or feet.  Gastrointestinal: Denies nausea or vomiting  No other specific complaints in a complete review of systems (except as listed in HPI above).      Objective:   Physical Exam BP 120/68 mmHg  Pulse 102  Temp(Src) 98 F (36.7 C) (Oral)  Wt 189  lb (85.73 kg)  SpO2 98% Wt Readings from Last 3 Encounters:  07/23/14 189 lb (85.73 kg)  06/25/14 190 lb 8 oz (86.41 kg)  02/08/14 187 lb 4 oz (84.936 kg)    General: Pt is ill appearing, appears his stated age, well developed, well nourished. HEENT: Head: normal shape and size; Eyes: sclera erythematous, no icterus, conjunctiva pink, PERRLA and EOMs intact; Ears: Tm's gray and intact, normal light reflex; Nose: mucosa pink and moist, septum midline; Throat/Mouth: Teeth present, mucosa red and moist, no exudate, lesions or ulcerations noted.  Neck: Normal range of motion. Neck supple, trachea midline. No massses, lumps or thyromegaly present.  Cardiovascular: Tachycardic with regular rhythm. S1,S2 noted.  No murmur, rubs or gallops noted. No JVD or BLE edema. No carotid bruits noted. Pulmonary/Chest: Labored breathing with bronchial breath sounds noted.  Wheezes noted throughout the lung fields   EKG:  BMET    Component Value Date/Time   NA 139 06/25/2014 1600   K 4.1 06/25/2014 1600   CL 102 06/25/2014 1600   CO2 29 06/25/2014 1600   GLUCOSE 100* 06/25/2014 1600   BUN 10 06/25/2014 1600   CREATININE 0.8 06/25/2014 1600   CALCIUM 9.8 06/25/2014 1600   GFRNONAA 115 11/09/2008 0939   GFRAA 139 11/09/2008 0939    Lipid Panel     Component Value Date/Time   CHOL 155 04/28/2011 0952   TRIG 205.0* 04/28/2011 0952   HDL 33.40* 04/28/2011 0952   CHOLHDL 5 04/28/2011 0952   VLDL 41.0* 04/28/2011 0952    CBC No results found for: WBC, RBC, HGB, HCT, PLT, MCV, MCH, MCHC, RDW, LYMPHSABS, MONOABS, EOSABS, BASOSABS  Hgb A1C No results found for: HGBA1C        Assessment & Plan:  COPD Exacerbation in current smoker - Prescribed Z-pak and prednisone - Encouraged rest and sent pt home with work note for today and tomorrow -Told the pt to call/follow-up if symptoms worsen or if not showing any progress in 1 week.

## 2014-07-23 NOTE — Patient Instructions (Signed)
Cough, Adult  A cough is a reflex that helps clear your throat and airways. It can help heal the body or may be a reaction to an irritated airway. A cough may only last 2 or 3 weeks (acute) or may last more than 8 weeks (chronic).  CAUSES Acute cough:  Viral or bacterial infections. Chronic cough:  Infections.  Allergies.  Asthma.  Post-nasal drip.  Smoking.  Heartburn or acid reflux.  Some medicines.  Chronic lung problems (COPD).  Cancer. SYMPTOMS   Cough.  Fever.  Chest pain.  Increased breathing rate.  High-pitched whistling sound when breathing (wheezing).  Colored mucus that you cough up (sputum). TREATMENT   A bacterial cough may be treated with antibiotic medicine.  A viral cough must run its course and will not respond to antibiotics.  Your caregiver may recommend other treatments if you have a chronic cough. HOME CARE INSTRUCTIONS   Only take over-the-counter or prescription medicines for pain, discomfort, or fever as directed by your caregiver. Use cough suppressants only as directed by your caregiver.  Use a cold steam vaporizer or humidifier in your bedroom or home to help loosen secretions.  Sleep in a semi-upright position if your cough is worse at night.  Rest as needed.  Stop smoking if you smoke. SEEK IMMEDIATE MEDICAL CARE IF:   You have pus in your sputum.  Your cough starts to worsen.  You cannot control your cough with suppressants and are losing sleep.  You begin coughing up blood.  You have difficulty breathing.  You develop pain which is getting worse or is uncontrolled with medicine.  You have a fever. MAKE SURE YOU:   Understand these instructions.  Will watch your condition.  Will get help right away if you are not doing well or get worse. Document Released: 03/06/2011 Document Revised: 11/30/2011 Document Reviewed: 03/06/2011 ExitCare Patient Information 2015 [employer]. This information is not intended  to replace advice given to you by your health care provider. Make sure you discuss any questions you have with your health care provider.  

## 2014-07-23 NOTE — Progress Notes (Signed)
Pre visit review using our clinic review tool, if applicable. No additional management support is needed unless otherwise documented below in the visit note. 

## 2014-08-13 ENCOUNTER — Other Ambulatory Visit: Payer: Self-pay | Admitting: Family Medicine

## 2014-08-13 NOTE — Telephone Encounter (Signed)
Received refill request electronically from pharmacy. Last refill 06/06/14 #30/1, last office visit 07/23/14/acute. Is it okay to refill medication?

## 2014-08-14 NOTE — Telephone Encounter (Signed)
Please call in.  Thanks.   

## 2014-08-14 NOTE — Telephone Encounter (Signed)
Medication phoned to pharmacy.  

## 2014-10-05 ENCOUNTER — Other Ambulatory Visit: Payer: Self-pay | Admitting: Family Medicine

## 2014-10-05 NOTE — Telephone Encounter (Signed)
Pt left v/m requesting refill of clonazepam to [facility] [place] today; pt request wants med called in before the weekend.Please advise.

## 2014-10-05 NOTE — Telephone Encounter (Signed)
Electronic refill request. Last Filled:    30 tablet 1 RF on 08/14/2014  Please advise.

## 2014-10-07 NOTE — Telephone Encounter (Signed)
Please call in.  We need at least 1 business day before fills, esp on controlled meds.  Due for f/u re: depression/anxiety, please schedule. Thanks.

## 2014-10-08 NOTE — Telephone Encounter (Signed)
Medication phoned to pharmacy. Patient advised. Appointment scheduled.

## 2014-10-10 ENCOUNTER — Ambulatory Visit (INDEPENDENT_AMBULATORY_CARE_PROVIDER_SITE_OTHER): Payer: BLUE CROSS/BLUE SHIELD | Admitting: Family Medicine

## 2014-10-10 ENCOUNTER — Encounter: Payer: Self-pay | Admitting: Family Medicine

## 2014-10-10 VITALS — BP 130/78 | HR 94 | Temp 99.0°F | Wt 191.5 lb

## 2014-10-10 DIAGNOSIS — F411 Generalized anxiety disorder: Secondary | ICD-10-CM

## 2014-10-10 DIAGNOSIS — I10 Essential (primary) hypertension: Secondary | ICD-10-CM

## 2014-10-10 LAB — BASIC METABOLIC PANEL
BUN: 13 mg/dL (ref 6–23)
CHLORIDE: 99 meq/L (ref 96–112)
CO2: 24 meq/L (ref 19–32)
Calcium: 9.9 mg/dL (ref 8.4–10.5)
Creat: 0.68 mg/dL (ref 0.50–1.35)
Glucose, Bld: 107 mg/dL — ABNORMAL HIGH (ref 70–99)
POTASSIUM: 3.8 meq/L (ref 3.5–5.3)
SODIUM: 136 meq/L (ref 135–145)

## 2014-10-10 MED ORDER — LISINOPRIL 10 MG PO TABS: ORAL_TABLET | ORAL | Status: DC

## 2014-10-10 MED ORDER — CITALOPRAM HYDROBROMIDE 20 MG PO TABS: 20.0000 mg | ORAL_TABLET | Freq: Every day | ORAL | Status: DC

## 2014-10-10 NOTE — Progress Notes (Signed)
Pre visit review using our clinic review tool, if applicable. No additional management support is needed unless otherwise documented below in the visit note.  Anxiety.  He is getting more anxious and still needing his klonopin daily.  No ADE on med, but less effect from the medicine.  Inc in job stress.  Was prev on celexa, off it for a long period of time.  His older son had to come home from college, he's over at [facility] now and working part time.  That has added to his stress.   Hypertension:    Using medication without problems or lightheadedness: occ lightheaded but that is rare Chest pain with exertion: no.  He's had some occ chest tightness but it isn't exertional.  Still smoking and that may contribute, down to 1/2 PPD.  Edema:no Short of breath:only if leaving feet hanging down in the recliner Average home BPs: improved on higher dose of lisinopril, increased about 2 weeks ago.   Meds, vitals, and allergies reviewed.   ROS: See HPI.  Otherwise, noncontributory.  GEN: nad, alert and oriented HEENT: mucous membranes moist NECK: supple w/o LA CV: rrr.  no murmur PULM: coarse BS but o/w ctab, no inc wob ABD: soft, +bs EXT: no edema SKIN: no acute rash

## 2014-10-10 NOTE — Patient Instructions (Addendum)
I would try restarting the celexa.  I sent it to the pharmacy.  Go to the lab on the way out.  We'll contact you with your lab report. Take care.  Glad to see you.

## 2014-10-11 ENCOUNTER — Encounter: Payer: Self-pay | Admitting: Family Medicine

## 2014-10-11 ENCOUNTER — Encounter: Payer: Self-pay | Admitting: *Deleted

## 2014-10-11 NOTE — Assessment & Plan Note (Signed)
Continue as is, BP controlled, see notes on labs.

## 2014-10-11 NOTE — Assessment & Plan Note (Signed)
Add back citalopram, continue BZD as is, d/w pt.  Still okay for outpatient f/u.

## 2014-11-06 ENCOUNTER — Ambulatory Visit: Payer: Self-pay | Admitting: Family Medicine

## 2014-11-13 ENCOUNTER — Ambulatory Visit: Payer: Self-pay | Admitting: Orthopedic Surgery

## 2014-12-10 ENCOUNTER — Other Ambulatory Visit: Payer: Self-pay | Admitting: Family Medicine

## 2014-12-10 NOTE — Telephone Encounter (Signed)
Electronic refill request. Last Filled:    30 tablet 1 RF on 10/07/2014  Please advise.

## 2014-12-11 NOTE — Telephone Encounter (Signed)
Please call in.  Thanks.   

## 2014-12-11 NOTE — Telephone Encounter (Signed)
Medication phoned to pharmacy.  

## 2014-12-20 ENCOUNTER — Ambulatory Visit (INDEPENDENT_AMBULATORY_CARE_PROVIDER_SITE_OTHER): Payer: BLUE CROSS/BLUE SHIELD

## 2014-12-20 DIAGNOSIS — Z23 Encounter for immunization: Secondary | ICD-10-CM | POA: Diagnosis not present

## 2015-02-05 ENCOUNTER — Other Ambulatory Visit: Payer: Self-pay | Admitting: Family Medicine

## 2015-02-05 NOTE — Telephone Encounter (Signed)
Medication phoned to pharmacy.  

## 2015-02-05 NOTE — Telephone Encounter (Signed)
Please call in.  Thanks.   

## 2015-02-05 NOTE — Telephone Encounter (Signed)
Electronic refill request. Last Filled:  30 tablet 1 RF on 12/11/2014  Please advise.

## 2015-04-01 ENCOUNTER — Other Ambulatory Visit: Payer: Self-pay | Admitting: Family Medicine

## 2015-04-01 NOTE — Telephone Encounter (Signed)
Electronic refill request. Last Filled:    30 tablet 1 RF on 02/05/2015  Please advise.

## 2015-04-02 NOTE — Telephone Encounter (Signed)
Please call in.  Thanks.   

## 2015-04-02 NOTE — Telephone Encounter (Signed)
Medication phoned to pharmacy.  

## 2015-06-10 ENCOUNTER — Other Ambulatory Visit: Payer: Self-pay | Admitting: Family Medicine

## 2015-06-10 NOTE — Telephone Encounter (Signed)
Please call in.  Thanks.   

## 2015-06-10 NOTE — Telephone Encounter (Signed)
Received refill request electronically Last office visit 10/10/14 Last refill 04/02/15 #30/1 Is it okay to refill medication?

## 2015-06-11 NOTE — Telephone Encounter (Signed)
Rx called to pharmacy as instructed. 

## 2015-07-25 ENCOUNTER — Other Ambulatory Visit: Payer: Self-pay | Admitting: Family Medicine

## 2015-07-26 NOTE — Telephone Encounter (Signed)
Patient is due for CPE.  Called to schedule.

## 2015-08-12 ENCOUNTER — Other Ambulatory Visit: Payer: Self-pay | Admitting: Family Medicine

## 2015-08-12 NOTE — Telephone Encounter (Signed)
Electronic refill request. Last Filled:    30 tablet 1 06/10/2015  Please advise.

## 2015-08-13 ENCOUNTER — Encounter: Payer: Self-pay | Admitting: *Deleted

## 2015-08-13 NOTE — Telephone Encounter (Signed)
Please call in.  Due for CPE.  Thanks.

## 2015-08-13 NOTE — Telephone Encounter (Signed)
Medication phoned to pharmacy.   Message left at pharmacy to ask patient to schedule CPE.

## 2015-08-27 ENCOUNTER — Ambulatory Visit (INDEPENDENT_AMBULATORY_CARE_PROVIDER_SITE_OTHER): Payer: BLUE CROSS/BLUE SHIELD | Admitting: Family Medicine

## 2015-08-27 ENCOUNTER — Encounter: Payer: Self-pay | Admitting: Family Medicine

## 2015-08-27 VITALS — BP 156/88 | HR 86 | Temp 98.9°F | Wt 191.5 lb

## 2015-08-27 DIAGNOSIS — H6981 Other specified disorders of Eustachian tube, right ear: Secondary | ICD-10-CM | POA: Diagnosis not present

## 2015-08-27 MED ORDER — LISINOPRIL 10 MG PO TABS: ORAL_TABLET | ORAL | Status: DC

## 2015-08-27 MED ORDER — FLUTICASONE PROPIONATE 50 MCG/ACT NA SUSP: 2.0000 | Freq: Every day | NASAL | Status: DC

## 2015-08-27 NOTE — Patient Instructions (Signed)
Alternate a heating pad and ice on your neck.  Likely a muscle strain. Gently try to pop your ears and use the flonase daily.  Take care.

## 2015-08-27 NOTE — Progress Notes (Signed)
Pre visit review using our clinic review tool, if applicable. No additional management support is needed unless otherwise documented below in the visit note.  R ear pain and ringing.  H/o hearing loss in R ear that isn't acute, ie going on for many weeks.  No fevers.  No vomiting.  No rhinorrhea.   Off BP meds, rx done at [facility], has CPE scheduled.  Smoking about 1/2 PPD, less than prev.     Meds, vitals, and allergies reviewed.   ROS: See HPI.  Otherwise, noncontributory.  nad ncat TM wnl B Weber louder on the R ear.  Air>bone conduction B Unable to more R TM with valsalva.   Nasal exam stuffy OP wnl Neck supple but with R sided paraspinal muscles ttp posteriorly, no LA, no midline pain.

## 2015-08-28 DIAGNOSIS — H6980 Other specified disorders of Eustachian tube, unspecified ear: Secondary | ICD-10-CM | POA: Insufficient documentation

## 2015-08-28 NOTE — Assessment & Plan Note (Signed)
With a likely muscle strain in the neck, incidental. Alternate a heating pad and ice on neck.  Gently valsalva, use flonase daily, f/u prn.  D/w pt.

## 2015-10-18 ENCOUNTER — Encounter: Payer: Self-pay | Admitting: Family Medicine

## 2015-10-30 ENCOUNTER — Encounter: Payer: BLUE CROSS/BLUE SHIELD | Admitting: Family Medicine

## 2015-11-05 ENCOUNTER — Other Ambulatory Visit: Payer: Self-pay | Admitting: Family Medicine

## 2015-11-06 NOTE — Telephone Encounter (Signed)
Last office visit 08/27/2015.  Last refilled 08/13/2015 for #30 with 1 refill.  Ok to refill?

## 2015-11-07 NOTE — Telephone Encounter (Signed)
Please call in.  Thanks.   

## 2015-11-07 NOTE — Telephone Encounter (Signed)
Medication phoned to pharmacy.  

## 2016-02-03 ENCOUNTER — Other Ambulatory Visit: Payer: Self-pay | Admitting: Family Medicine

## 2016-02-03 NOTE — Telephone Encounter (Signed)
Please call in.  Thanks.   

## 2016-02-03 NOTE — Telephone Encounter (Signed)
Electronic refill request. Last Filled:   30 tablet 1 11/07/2015  Last office visit:   08/27/15  Please advise.

## 2016-02-04 NOTE — Telephone Encounter (Signed)
Medication phoned to pharmacy.  

## 2016-03-13 DIAGNOSIS — N200 Calculus of kidney: Secondary | ICD-10-CM | POA: Diagnosis not present

## 2016-03-17 DIAGNOSIS — N200 Calculus of kidney: Secondary | ICD-10-CM | POA: Diagnosis not present

## 2016-03-17 DIAGNOSIS — R109 Unspecified abdominal pain: Secondary | ICD-10-CM | POA: Diagnosis not present

## 2016-03-30 ENCOUNTER — Other Ambulatory Visit: Payer: Self-pay | Admitting: Family Medicine

## 2016-03-30 NOTE — Telephone Encounter (Signed)
Electronic refill request. Last Filled:    30 tablet 1 02/03/2016  Last office visit:   08/27/15  Canceled CPE on 10/30/15.  Please advise.

## 2016-03-31 NOTE — Telephone Encounter (Signed)
Call if he is scheduled for CPE.  Thanks.

## 2016-03-31 NOTE — Telephone Encounter (Signed)
Pt wanted to ck on status of clonazepam refill; spoke with [facility] and rx ready for pick up. Pt will ck with pharmacy.

## 2016-03-31 NOTE — Telephone Encounter (Signed)
Wife advised. 

## 2016-03-31 NOTE — Telephone Encounter (Signed)
Medication phoned to pharmacy.  

## 2016-04-01 ENCOUNTER — Ambulatory Visit (INDEPENDENT_AMBULATORY_CARE_PROVIDER_SITE_OTHER): Payer: BLUE CROSS/BLUE SHIELD | Admitting: Family Medicine

## 2016-04-01 ENCOUNTER — Encounter: Payer: Self-pay | Admitting: Family Medicine

## 2016-04-01 ENCOUNTER — Ambulatory Visit (INDEPENDENT_AMBULATORY_CARE_PROVIDER_SITE_OTHER)
Admission: RE | Admit: 2016-04-01 | Discharge: 2016-04-01 | Disposition: A | Discharge status: Home / Self Care | Payer: BLUE CROSS/BLUE SHIELD | Source: Ambulatory Visit | Attending: Family Medicine | Admitting: Family Medicine

## 2016-04-01 VITALS — BP 160/100 | HR 86 | Temp 98.8°F | Ht 67.0 in | Wt 183.8 lb

## 2016-04-01 DIAGNOSIS — F17200 Nicotine dependence, unspecified, uncomplicated: Secondary | ICD-10-CM

## 2016-04-01 DIAGNOSIS — N200 Calculus of kidney: Secondary | ICD-10-CM

## 2016-04-01 DIAGNOSIS — Z720 Tobacco use: Secondary | ICD-10-CM

## 2016-04-01 DIAGNOSIS — R109 Unspecified abdominal pain: Secondary | ICD-10-CM | POA: Diagnosis not present

## 2016-04-01 MED ORDER — ALBUTEROL SULFATE HFA 108 (90 BASE) MCG/ACT IN AERS: 2.0000 | INHALATION_SPRAY | Freq: Four times a day (QID) | RESPIRATORY_TRACT | Status: DC | PRN

## 2016-04-01 MED ORDER — OXYCODONE-ACETAMINOPHEN 5-325 MG PO TABS: 1.0000 | ORAL_TABLET | ORAL | Status: DC | PRN

## 2016-04-01 NOTE — Patient Instructions (Signed)
Go to the lab on the way out.  We'll contact you with your xray report. If not better soon, then update me so we can set you up with urology.  Continue rapaflo for now.  Use the inhaler as needed.   I would try a substitute instead of the cigarettes.  Take care.  Glad to see you.

## 2016-04-01 NOTE — Progress Notes (Signed)
Pre visit review using our clinic review tool, if applicable. No additional management support is needed unless otherwise documented below in the visit note.  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  [place].  Had seen blood in urine.  CT w/o stone seen but felt like prev stones.  U/a with blood noted on outside records.  No fevers.  Some pain radiating down to the groin.  Outside labs reviewed.    Last saw urology years ago.  No stones prior to this in the last year or so.  Usually can pass the stones, but has had lithotripsy prev.    No vomiting.  On baseline meds.  Still with some rapaflo but out of oxycodone.    Some chest congestion and wheezing.  Going on for the last 2 weeks.  Still smoking, d/w pt about cessation.   PMH and SH reviewed  ROS: Per HPI unless specifically indicated in ROS section   Meds, vitals, and allergies reviewed.   GEN: nad, alert and oriented HEENT: mucous membranes moist NECK: supple w/o LA CV: rrr.  PULM: ctab except for scant exp wheeze B, no inc wob ABD: soft, +bs EXT: no edema SKIN: no acute rash R flank ttp but no CVA pain

## 2016-04-02 ENCOUNTER — Encounter: Payer: Self-pay | Admitting: Family Medicine

## 2016-04-02 NOTE — Assessment & Plan Note (Signed)
Presumed, d/w pt.  Recheck KUB to see if any stone can be seen.  May not be seen if urate stone.  Now with some groin pain, which may represent movement of stone and that would be a good thing for passage.  Continue rapaflo with prn oxycodone and update [place] as needed.  May need uro eval if not passed soon.  He agrees.  Okay for outpatient f/u.  KUB reviewed, no stones noted. See notes on imaging.

## 2016-04-09 ENCOUNTER — Encounter: Payer: Self-pay | Admitting: *Deleted

## 2016-04-29 ENCOUNTER — Other Ambulatory Visit: Payer: Self-pay | Admitting: Family Medicine

## 2016-04-29 NOTE — Telephone Encounter (Signed)
Please call in.  Thanks.   

## 2016-04-29 NOTE — Telephone Encounter (Signed)
Pt is requesting a refill, last filed on 03/31/16 #30. Last OV 04/01/16. Okay to refill?

## 2016-04-29 NOTE — Telephone Encounter (Signed)
Phoned in to [facility] in [place].

## 2016-05-13 ENCOUNTER — Other Ambulatory Visit: Payer: Self-pay | Admitting: Family Medicine

## 2016-05-13 NOTE — Telephone Encounter (Signed)
Received refill request electronically Last refill 07/26/15 #5/3 Last office visit 04/01/16

## 2016-05-14 NOTE — Telephone Encounter (Signed)
Sent!

## 2016-05-28 ENCOUNTER — Other Ambulatory Visit: Payer: Self-pay | Admitting: Family Medicine

## 2016-05-28 NOTE — Telephone Encounter (Signed)
Electronic refill request.   30 tablet 0 04/29/2016   Please advise.

## 2016-05-29 NOTE — Telephone Encounter (Signed)
[facility] called and request the status of Rx.  Spoken with pharmacist and gave the verbal Rx.

## 2016-05-29 NOTE — Telephone Encounter (Signed)
Please call in.  Thanks.   

## 2016-06-06 DIAGNOSIS — Z79899 Other long term (current) drug therapy: Secondary | ICD-10-CM | POA: Diagnosis not present

## 2016-06-06 DIAGNOSIS — I10 Essential (primary) hypertension: Secondary | ICD-10-CM | POA: Diagnosis not present

## 2016-06-06 DIAGNOSIS — R05 Cough: Secondary | ICD-10-CM | POA: Diagnosis not present

## 2016-06-06 DIAGNOSIS — R9431 Abnormal electrocardiogram [ECG] [EKG]: Secondary | ICD-10-CM | POA: Diagnosis not present

## 2016-06-06 DIAGNOSIS — R51 Headache: Secondary | ICD-10-CM | POA: Diagnosis not present

## 2016-06-06 DIAGNOSIS — R002 Palpitations: Secondary | ICD-10-CM | POA: Diagnosis not present

## 2016-06-06 DIAGNOSIS — Z8249 Family history of ischemic heart disease and other diseases of the circulatory system: Secondary | ICD-10-CM | POA: Diagnosis not present

## 2016-06-06 DIAGNOSIS — R079 Chest pain, unspecified: Secondary | ICD-10-CM | POA: Diagnosis not present

## 2016-06-06 DIAGNOSIS — Z5181 Encounter for therapeutic drug level monitoring: Secondary | ICD-10-CM | POA: Diagnosis not present

## 2016-06-06 DIAGNOSIS — Z87891 Personal history of nicotine dependence: Secondary | ICD-10-CM | POA: Diagnosis not present

## 2016-06-06 DIAGNOSIS — R6884 Jaw pain: Secondary | ICD-10-CM | POA: Diagnosis not present

## 2016-06-06 DIAGNOSIS — R42 Dizziness and giddiness: Secondary | ICD-10-CM | POA: Diagnosis not present

## 2016-06-06 DIAGNOSIS — R61 Generalized hyperhidrosis: Secondary | ICD-10-CM | POA: Diagnosis not present

## 2016-06-06 DIAGNOSIS — R0781 Pleurodynia: Secondary | ICD-10-CM | POA: Diagnosis not present

## 2016-06-06 DIAGNOSIS — R0602 Shortness of breath: Secondary | ICD-10-CM | POA: Diagnosis not present

## 2016-06-06 DIAGNOSIS — R931 Abnormal findings on diagnostic imaging of heart and coronary circulation: Secondary | ICD-10-CM | POA: Diagnosis not present

## 2016-06-07 ENCOUNTER — Telehealth: Payer: Self-pay | Admitting: Family Medicine

## 2016-06-07 DIAGNOSIS — R05 Cough: Secondary | ICD-10-CM | POA: Diagnosis not present

## 2016-06-07 DIAGNOSIS — R079 Chest pain, unspecified: Secondary | ICD-10-CM | POA: Diagnosis not present

## 2016-06-07 DIAGNOSIS — I10 Essential (primary) hypertension: Secondary | ICD-10-CM | POA: Diagnosis not present

## 2016-06-07 DIAGNOSIS — R931 Abnormal findings on diagnostic imaging of heart and coronary circulation: Secondary | ICD-10-CM | POA: Diagnosis not present

## 2016-06-07 NOTE — Telephone Encounter (Signed)
Please get update on patient. Recently in emergency room. Thanks.

## 2016-06-08 ENCOUNTER — Encounter: Payer: Self-pay | Admitting: *Deleted

## 2016-06-08 NOTE — Telephone Encounter (Signed)
I have been unable to reach this patient by phone for some time now.  No VM is set up.  That is the case again today.  Letter mailed.

## 2016-06-11 NOTE — Telephone Encounter (Signed)
Thanks

## 2016-07-10 DIAGNOSIS — I10 Essential (primary) hypertension: Secondary | ICD-10-CM | POA: Diagnosis not present

## 2016-07-10 DIAGNOSIS — N529 Male erectile dysfunction, unspecified: Secondary | ICD-10-CM | POA: Diagnosis not present

## 2016-07-10 DIAGNOSIS — R9439 Abnormal result of other cardiovascular function study: Secondary | ICD-10-CM | POA: Diagnosis not present

## 2016-07-29 ENCOUNTER — Other Ambulatory Visit: Payer: Self-pay | Admitting: Family Medicine

## 2016-07-29 NOTE — Telephone Encounter (Signed)
Last refill 05/29/16 #30/1 Last office visit 04/01/16

## 2016-07-30 NOTE — Telephone Encounter (Signed)
Medication phoned to pharmacy.  

## 2016-07-30 NOTE — Telephone Encounter (Signed)
Pt called to ck on status of clonazepam refill; advised pt called in today at 10:15; pt will ck with pharmacy.

## 2016-09-25 ENCOUNTER — Other Ambulatory Visit: Payer: Self-pay | Admitting: Family Medicine

## 2016-09-25 NOTE — Telephone Encounter (Signed)
Electronic refill request. Last Filled:    30 tablet 1 07/30/2016  Last office visit:   CPE 04/01/16   Please advise.

## 2016-09-27 NOTE — Telephone Encounter (Signed)
Please call in.  Thanks.   

## 2016-09-28 NOTE — Telephone Encounter (Signed)
Medication phoned to pharmacy.  

## 2016-11-23 ENCOUNTER — Other Ambulatory Visit: Payer: Self-pay | Admitting: Family Medicine

## 2016-11-23 NOTE — Telephone Encounter (Signed)
Received refill request electronically Last refill 09/27/16 #30/1 Last office visit 04/01/16

## 2016-11-23 NOTE — Telephone Encounter (Signed)
Please call in. Due for physical. Please schedule.

## 2016-11-24 NOTE — Telephone Encounter (Signed)
Attempted to contact pt; unable to leave message. Rx called in to requested pharmacy

## 2016-12-23 ENCOUNTER — Other Ambulatory Visit: Payer: Self-pay | Admitting: Family Medicine

## 2016-12-24 NOTE — Telephone Encounter (Signed)
CPE was on 04/01/16, last filled on 11/23/16 #30 tabs with 0 refills, please advise

## 2016-12-25 NOTE — Telephone Encounter (Signed)
Linda scheduled a CPE and Rx called in as prescribed

## 2016-12-25 NOTE — Telephone Encounter (Signed)
Please call in.  Thanks.  Needs CPE set up for the summer.

## 2017-01-25 ENCOUNTER — Other Ambulatory Visit: Payer: Self-pay | Admitting: Family Medicine

## 2017-01-25 NOTE — Telephone Encounter (Signed)
Electronic refill request. Last office visit:   04/01/16 Last Filled:    30 tablet 0 12/25/2016  Please advise.

## 2017-01-25 NOTE — Telephone Encounter (Signed)
Please call in.  Thanks.   

## 2017-01-26 NOTE — Telephone Encounter (Signed)
Medication phoned to pharmacy.  

## 2017-02-19 ENCOUNTER — Other Ambulatory Visit: Payer: Self-pay | Admitting: Family Medicine

## 2017-03-25 ENCOUNTER — Other Ambulatory Visit: Payer: Self-pay | Admitting: Family Medicine

## 2017-03-25 NOTE — Telephone Encounter (Signed)
Electronic refill request. Clonazepam Last office visit:   04/01/16   Upcoming labs and CPE scheduled. Last Filled:    30 tablet 1 01/25/2017  Please advise.

## 2017-03-25 NOTE — Telephone Encounter (Signed)
Please call in.  Thanks.   

## 2017-03-26 NOTE — Telephone Encounter (Signed)
Medication phoned to pharmacy.  

## 2017-03-29 DIAGNOSIS — H6090 Unspecified otitis externa, unspecified ear: Secondary | ICD-10-CM | POA: Diagnosis not present

## 2017-04-05 ENCOUNTER — Other Ambulatory Visit: Payer: Self-pay | Admitting: Family Medicine

## 2017-04-05 ENCOUNTER — Other Ambulatory Visit (INDEPENDENT_AMBULATORY_CARE_PROVIDER_SITE_OTHER): Payer: BLUE CROSS/BLUE SHIELD

## 2017-04-05 DIAGNOSIS — E7800 Pure hypercholesterolemia, unspecified: Secondary | ICD-10-CM

## 2017-04-05 DIAGNOSIS — R7309 Other abnormal glucose: Secondary | ICD-10-CM

## 2017-04-06 LAB — COMPREHENSIVE METABOLIC PANEL
ALK PHOS: 62 U/L (ref 39–117)
ALT: 38 U/L (ref 0–53)
AST: 24 U/L (ref 0–37)
Albumin: 4.3 g/dL (ref 3.5–5.2)
BUN: 11 mg/dL (ref 6–23)
CHLORIDE: 103 meq/L (ref 96–112)
CO2: 32 mEq/L (ref 19–32)
Calcium: 9.6 mg/dL (ref 8.4–10.5)
Creatinine, Ser: 0.75 mg/dL (ref 0.40–1.50)
GFR: 118.57 mL/min (ref >60.00)
GLUCOSE: 85 mg/dL (ref 70–99)
POTASSIUM: 4.5 meq/L (ref 3.5–5.1)
SODIUM: 140 meq/L (ref 135–145)
TOTAL PROTEIN: 7 g/dL (ref 6.0–8.3)
Total Bilirubin: 0.7 mg/dL (ref 0.2–1.2)

## 2017-04-06 LAB — LIPID PANEL
CHOL/HDL RATIO: 7
Cholesterol: 247 mg/dL — ABNORMAL HIGH (ref 0–200)
HDL: 33.6 mg/dL — AB (ref >39.00)
NonHDL: 212.96
Triglycerides: 238 mg/dL — ABNORMAL HIGH (ref 0.0–149.0)
VLDL: 47.6 mg/dL — AB (ref 0.0–40.0)

## 2017-04-06 LAB — LDL CHOLESTEROL, DIRECT: Direct LDL: 187 mg/dL

## 2017-04-06 LAB — HEMOGLOBIN A1C: HEMOGLOBIN A1C: 5.6 % (ref 4.6–6.5)

## 2017-04-07 ENCOUNTER — Encounter: Payer: Self-pay | Admitting: Family Medicine

## 2017-04-07 ENCOUNTER — Ambulatory Visit (INDEPENDENT_AMBULATORY_CARE_PROVIDER_SITE_OTHER): Payer: BLUE CROSS/BLUE SHIELD | Admitting: Family Medicine

## 2017-04-07 VITALS — BP 140/82 | HR 76 | Temp 98.4°F | Ht 67.0 in | Wt 195.5 lb

## 2017-04-07 DIAGNOSIS — M542 Cervicalgia: Secondary | ICD-10-CM

## 2017-04-07 DIAGNOSIS — Z7189 Other specified counseling: Secondary | ICD-10-CM

## 2017-04-07 DIAGNOSIS — E7800 Pure hypercholesterolemia, unspecified: Secondary | ICD-10-CM

## 2017-04-07 DIAGNOSIS — Z0001 Encounter for general adult medical examination with abnormal findings: Secondary | ICD-10-CM | POA: Diagnosis not present

## 2017-04-07 DIAGNOSIS — N529 Male erectile dysfunction, unspecified: Secondary | ICD-10-CM

## 2017-04-07 DIAGNOSIS — E785 Hyperlipidemia, unspecified: Secondary | ICD-10-CM

## 2017-04-07 DIAGNOSIS — I10 Essential (primary) hypertension: Secondary | ICD-10-CM

## 2017-04-07 DIAGNOSIS — F411 Generalized anxiety disorder: Secondary | ICD-10-CM

## 2017-04-07 MED ORDER — ATORVASTATIN CALCIUM 20 MG PO TABS: 20.0000 mg | ORAL_TABLET | Freq: Every day | ORAL | 3 refills | Status: DC

## 2017-04-07 MED ORDER — AMLODIPINE BESYLATE 10 MG PO TABS: 10.0000 mg | ORAL_TABLET | Freq: Every day | ORAL | 3 refills | Status: DC

## 2017-04-07 MED ORDER — DULOXETINE HCL 30 MG PO CPEP: 30.0000 mg | ORAL_CAPSULE | Freq: Every day | ORAL | 3 refills | Status: DC

## 2017-04-07 MED ORDER — LISINOPRIL 10 MG PO TABS: 15.0000 mg | ORAL_TABLET | Freq: Every day | ORAL | 3 refills | Status: DC

## 2017-04-07 NOTE — Patient Instructions (Addendum)
Call the spine clinic in [place] and see what they have to offer.   Change simvastatin to atorvastatin since your are on amlodipine.  If you have more aches the let me know.  Try cymbalta and see if the neck pain is some better and if you need klonopin less.    Lab visit in about 2-3 months, fasting lipid and liver check.   Take care.  Glad to see you.

## 2017-04-07 NOTE — Progress Notes (Signed)
CPE- See plan.  Routine anticipatory guidance given to patient.  See health maintenance.  The possibility exists that previously documented standard health maintenance information may have been brought forward from a previous encounter into this note.  If needed, that same information has been updated to reflect the current situation based on today's encounter.    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  D/w pt.   Flu encouraged.  PNA not due Shingles not due Colon and prostate cancer screening not due.  Living will d/w pt.  Wife designated if patient were incapacitated.   Diet and exercise d/w pt.  HIV screening d/w pt.  [facility].    ED noted.  Still smoking.  D/w pt- cessation would likely help.  Encouraged smoking cessation.  He prev quit for 4 months but then started back.  About 1 PPD now.  D/w pt.  He quit with the patch prev and some E cig use.    Anxiety.  Taking klonopin daily with some relief. No ADE on med.  No SI/HI.  D/w pt about BZD use and trying to change meds in the long run to limit BZD use.  cymbalta may be useful, d/w pt, esp given the the ongoing MSK neck pain.    Neck pain.  S/p prev fusion [facility].  Still with pain, occ flares.  Worse in the AM, now almost daily.  He has lower neck pain, but not the prev radicular arm pain.  D/w pt.    Hypertension:    Using medication without problems or lightheadedness: yes Chest pain with exertion:no Edema:no Short of breath:no  Elevated Cholesterol: Using medications without problems: off med but didn't have ADE when on med Muscle aches: no  Diet compliance: encouraged Exercise: encouraged.  Labs d/w pt.  Lipids up as expected of statin, d/w pt.   PMH and SH reviewed  Meds, vitals, and allergies reviewed.   ROS: Per HPI.  Unless specifically indicated otherwise in HPI, the patient denies:  General: fever. Eyes: acute vision changes ENT:  sore throat Cardiovascular: chest pain Respiratory: SOB GI: vomiting GU: dysuria Musculoskeletal: acute back pain Derm: acute rash Neuro: acute motor dysfunction Psych: worsening mood Endocrine: polydipsia Heme: bleeding Allergy: hayfever  GEN: nad, alert and oriented HEENT: mucous membranes moist NECK: supple w/o LA CV: rrr. PULM: ctab, no inc wob ABD: soft, +bs EXT: no edema SKIN: no acute rash Affect within normal limits. Speech and judgment normal.

## 2017-04-08 DIAGNOSIS — M542 Cervicalgia: Secondary | ICD-10-CM | POA: Insufficient documentation

## 2017-04-08 DIAGNOSIS — Z0001 Encounter for general adult medical examination with abnormal findings: Secondary | ICD-10-CM | POA: Insufficient documentation

## 2017-04-08 DIAGNOSIS — Z7189 Other specified counseling: Secondary | ICD-10-CM | POA: Insufficient documentation

## 2017-04-08 NOTE — Assessment & Plan Note (Signed)
Advised him to follow-up with the orthopedic clinic that had done his surgery previously. He does not have emergent symptoms, ex: no weakness in the hands. He agrees.

## 2017-04-08 NOTE — Assessment & Plan Note (Signed)
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  D/w pt.   Flu encouraged.  PNA not due Shingles not due Colon and prostate cancer screening not due.  Living will d/w pt.  Wife designated if patient were incapacitated.   Diet and exercise d/w pt.  HIV screening d/w pt.  [facility].

## 2017-04-08 NOTE — Assessment & Plan Note (Signed)
Living will d/w pt.  Wife designated if patient were incapacitated.   ?

## 2017-04-08 NOTE — Assessment & Plan Note (Signed)
Likely worsened about smoking. Discussed with patient about cessation. He is pre-contemplative.

## 2017-04-08 NOTE — Assessment & Plan Note (Signed)
Continue Klonopin. Add on Cymbalta. It may help mood and he may need benzodiazepine less. It may also help with his neck pain. Discussed with patient about routine cautions. Update me as needed. He agrees. Okay for outpatient follow-up.

## 2017-04-08 NOTE — Assessment & Plan Note (Signed)
Reasonable control. Discussed with patient about diet and exercise. No change in meds. He agrees. Labs discussed with patient.

## 2017-04-08 NOTE — Assessment & Plan Note (Signed)
Restart statin. He is on amlodipine, so change from simvastatin to atorvastatin and recheck lipids and LFTs in a few months. Discussed with patient about diet and exercise. He agrees. Rationale for med change discussed with patient.

## 2017-05-25 ENCOUNTER — Other Ambulatory Visit: Payer: Self-pay | Admitting: Family Medicine

## 2017-05-25 NOTE — Telephone Encounter (Signed)
Received refill electronically Last refill 03/25/17 #30/1 Last office visit 04/07/17

## 2017-05-26 NOTE — Telephone Encounter (Signed)
Pt left v/m requesting status of clonazepam refill.

## 2017-05-26 NOTE — Telephone Encounter (Signed)
We reserve at least 24-48 hours on refills.  He doesn't need to call back.  We'll do it when we can.  Thanks.

## 2017-05-26 NOTE — Telephone Encounter (Signed)
Okay to call in now? How many and any refills?

## 2017-05-27 NOTE — Telephone Encounter (Signed)
Please call in.  How is his pain and anxiety level with the addition on cymbalta?  Let me know. Thanks.

## 2017-05-27 NOTE — Telephone Encounter (Signed)
Medication phoned to pharmacy.  Patient states he only took the Cymbalta for 2 or 3 days because it made him feel nauseous and jittery.  Patient states that even the guys at work asked what was wrong with him, noticing him being so jittery.

## 2017-05-27 NOTE — Telephone Encounter (Signed)
Noted.  It was correct to stop it, given the reaction.  Added to intolerance list.  Thanks.

## 2017-06-10 ENCOUNTER — Other Ambulatory Visit (INDEPENDENT_AMBULATORY_CARE_PROVIDER_SITE_OTHER): Payer: BLUE CROSS/BLUE SHIELD

## 2017-06-10 ENCOUNTER — Encounter: Payer: Self-pay | Admitting: Family Medicine

## 2017-06-10 DIAGNOSIS — E785 Hyperlipidemia, unspecified: Secondary | ICD-10-CM

## 2017-06-10 LAB — HEPATIC FUNCTION PANEL
ALT: 35 U/L (ref 0–53)
AST: 23 U/L (ref 0–37)
Albumin: 4.2 g/dL (ref 3.5–5.2)
Alkaline Phosphatase: 78 U/L (ref 39–117)
BILIRUBIN DIRECT: 0.1 mg/dL (ref 0.0–0.3)
Total Bilirubin: 0.9 mg/dL (ref 0.2–1.2)
Total Protein: 7.1 g/dL (ref 6.0–8.3)

## 2017-06-10 LAB — LIPID PANEL
Cholesterol: 231 mg/dL — ABNORMAL HIGH (ref 0–200)
HDL: 33.4 mg/dL — ABNORMAL LOW (ref >39.00)
NONHDL: 197.37
Total CHOL/HDL Ratio: 7
Triglycerides: 280 mg/dL — ABNORMAL HIGH (ref 0.0–149.0)
VLDL: 56 mg/dL — AB (ref 0.0–40.0)

## 2017-06-10 LAB — LDL CHOLESTEROL, DIRECT: Direct LDL: 151 mg/dL

## 2017-06-12 ENCOUNTER — Other Ambulatory Visit: Payer: Self-pay | Admitting: Family Medicine

## 2017-06-12 DIAGNOSIS — E7800 Pure hypercholesterolemia, unspecified: Secondary | ICD-10-CM

## 2017-06-12 MED ORDER — ATORVASTATIN CALCIUM 20 MG PO TABS: 40.0000 mg | ORAL_TABLET | Freq: Every day | ORAL | Status: DC

## 2017-06-14 ENCOUNTER — Other Ambulatory Visit: Payer: Self-pay | Admitting: *Deleted

## 2017-06-14 DIAGNOSIS — E7800 Pure hypercholesterolemia, unspecified: Secondary | ICD-10-CM

## 2017-06-14 MED ORDER — ATORVASTATIN CALCIUM 20 MG PO TABS: 40.0000 mg | ORAL_TABLET | Freq: Every day | ORAL | 3 refills | Status: DC

## 2017-07-05 DIAGNOSIS — M5030 Other cervical disc degeneration, unspecified cervical region: Secondary | ICD-10-CM | POA: Diagnosis not present

## 2017-07-05 DIAGNOSIS — M5412 Radiculopathy, cervical region: Secondary | ICD-10-CM | POA: Diagnosis not present

## 2017-07-13 DIAGNOSIS — Z0189 Encounter for other specified special examinations: Secondary | ICD-10-CM | POA: Diagnosis not present

## 2017-07-13 DIAGNOSIS — M5412 Radiculopathy, cervical region: Secondary | ICD-10-CM | POA: Diagnosis not present

## 2017-07-16 DIAGNOSIS — M5030 Other cervical disc degeneration, unspecified cervical region: Secondary | ICD-10-CM | POA: Diagnosis not present

## 2017-07-16 DIAGNOSIS — M25512 Pain in left shoulder: Secondary | ICD-10-CM | POA: Diagnosis not present

## 2017-07-21 ENCOUNTER — Other Ambulatory Visit: Payer: Self-pay | Admitting: Family Medicine

## 2017-07-22 NOTE — Telephone Encounter (Signed)
Please call in.  Thanks.   

## 2017-07-22 NOTE — Telephone Encounter (Signed)
Last refill 05/27/17 #30 +1  Last OV 04/07/17  Ok to refill?

## 2017-07-23 NOTE — Telephone Encounter (Signed)
Rx called to pharmacy as instructed. 

## 2017-07-26 DIAGNOSIS — M5030 Other cervical disc degeneration, unspecified cervical region: Secondary | ICD-10-CM | POA: Diagnosis not present

## 2017-07-28 ENCOUNTER — Encounter: Payer: Self-pay | Admitting: Family Medicine

## 2017-07-28 ENCOUNTER — Ambulatory Visit (INDEPENDENT_AMBULATORY_CARE_PROVIDER_SITE_OTHER): Payer: BLUE CROSS/BLUE SHIELD | Admitting: Family Medicine

## 2017-07-28 VITALS — BP 130/80 | HR 86 | Temp 98.6°F | Wt 201.8 lb

## 2017-07-28 DIAGNOSIS — L989 Disorder of the skin and subcutaneous tissue, unspecified: Secondary | ICD-10-CM

## 2017-07-28 NOTE — Patient Instructions (Signed)
Rosaria Ferries will call about your referral. No charge for visit.  Take care.  Glad to see you.

## 2017-07-28 NOTE — Progress Notes (Signed)
He is getting ready to have surgery with surgery clinic rx'ing the oxycodone.  He had imaging done.  The surgery will likely be done in [place].  Still with sig neck pain.    Smoking cessation d/w pt.  Encouraged.    Lesion on chest well.  Present for long period of time.  Initially looked like a pimple.    Meds, vitals, and allergies reviewed.   ROS: Per HPI unless specifically indicated in ROS section   nad 5x28mm papule with central keratin horn on L chest wall.

## 2017-07-29 DIAGNOSIS — M5030 Other cervical disc degeneration, unspecified cervical region: Secondary | ICD-10-CM | POA: Diagnosis not present

## 2017-07-29 DIAGNOSIS — L989 Disorder of the skin and subcutaneous tissue, unspecified: Secondary | ICD-10-CM | POA: Insufficient documentation

## 2017-07-29 NOTE — Assessment & Plan Note (Signed)
The concern was for basal cell versus squamous cell skin cancer.  Discussed with patient.  Refer to Derm.  No charge.  He agrees.

## 2017-08-11 ENCOUNTER — Other Ambulatory Visit: Payer: BLUE CROSS/BLUE SHIELD

## 2017-08-16 DIAGNOSIS — B078 Other viral warts: Secondary | ICD-10-CM | POA: Diagnosis not present

## 2017-08-16 DIAGNOSIS — H65111 Acute and subacute allergic otitis media (mucoid) (sanguinous) (serous), right ear: Secondary | ICD-10-CM | POA: Diagnosis not present

## 2017-08-16 DIAGNOSIS — D485 Neoplasm of uncertain behavior of skin: Secondary | ICD-10-CM | POA: Diagnosis not present

## 2017-08-16 DIAGNOSIS — J189 Pneumonia, unspecified organism: Secondary | ICD-10-CM | POA: Diagnosis not present

## 2017-08-16 DIAGNOSIS — H6591 Unspecified nonsuppurative otitis media, right ear: Secondary | ICD-10-CM | POA: Diagnosis not present

## 2017-08-19 ENCOUNTER — Other Ambulatory Visit (INDEPENDENT_AMBULATORY_CARE_PROVIDER_SITE_OTHER): Payer: BLUE CROSS/BLUE SHIELD

## 2017-08-19 DIAGNOSIS — E7800 Pure hypercholesterolemia, unspecified: Secondary | ICD-10-CM | POA: Diagnosis not present

## 2017-08-20 LAB — LIPID PANEL
CHOLESTEROL: 179 mg/dL (ref 0–200)
HDL: 26.3 mg/dL — ABNORMAL LOW (ref >39.00)
NonHDL: 152.23
Total CHOL/HDL Ratio: 7
Triglycerides: 226 mg/dL — ABNORMAL HIGH (ref 0.0–149.0)
VLDL: 45.2 mg/dL — ABNORMAL HIGH (ref 0.0–40.0)

## 2017-08-20 LAB — LDL CHOLESTEROL, DIRECT: LDL DIRECT: 122 mg/dL

## 2017-08-24 ENCOUNTER — Other Ambulatory Visit: Payer: Self-pay | Admitting: Family Medicine

## 2017-08-25 NOTE — Telephone Encounter (Signed)
Last refill 04/24/16 #5/5 Last office visit 07/28/17 acutle

## 2017-08-25 NOTE — Telephone Encounter (Signed)
Patient notified as instructed by telephone and verbalized understanding. 

## 2017-08-25 NOTE — Telephone Encounter (Signed)
Sent.  Notify pt no point in trying for PA.  It is never covered.  Thanks.

## 2017-08-26 ENCOUNTER — Encounter: Payer: Self-pay | Admitting: *Deleted

## 2017-09-06 DIAGNOSIS — M5030 Other cervical disc degeneration, unspecified cervical region: Secondary | ICD-10-CM | POA: Diagnosis not present

## 2017-09-09 DIAGNOSIS — M5030 Other cervical disc degeneration, unspecified cervical region: Secondary | ICD-10-CM | POA: Diagnosis not present

## 2017-09-09 DIAGNOSIS — Z87891 Personal history of nicotine dependence: Secondary | ICD-10-CM | POA: Diagnosis not present

## 2017-09-09 DIAGNOSIS — E7800 Pure hypercholesterolemia, unspecified: Secondary | ICD-10-CM | POA: Diagnosis not present

## 2017-09-09 DIAGNOSIS — F419 Anxiety disorder, unspecified: Secondary | ICD-10-CM | POA: Diagnosis not present

## 2017-09-09 DIAGNOSIS — M50323 Other cervical disc degeneration at C6-C7 level: Secondary | ICD-10-CM | POA: Diagnosis not present

## 2017-09-09 DIAGNOSIS — E8589 Other amyloidosis: Secondary | ICD-10-CM | POA: Diagnosis not present

## 2017-09-09 DIAGNOSIS — M4802 Spinal stenosis, cervical region: Secondary | ICD-10-CM | POA: Diagnosis not present

## 2017-09-09 DIAGNOSIS — I10 Essential (primary) hypertension: Secondary | ICD-10-CM | POA: Diagnosis not present

## 2017-09-09 DIAGNOSIS — M50123 Cervical disc disorder at C6-C7 level with radiculopathy: Secondary | ICD-10-CM | POA: Diagnosis not present

## 2017-09-09 DIAGNOSIS — E785 Hyperlipidemia, unspecified: Secondary | ICD-10-CM | POA: Diagnosis not present

## 2017-09-10 DIAGNOSIS — E785 Hyperlipidemia, unspecified: Secondary | ICD-10-CM | POA: Diagnosis not present

## 2017-09-10 DIAGNOSIS — M50123 Cervical disc disorder at C6-C7 level with radiculopathy: Secondary | ICD-10-CM | POA: Diagnosis not present

## 2017-09-10 DIAGNOSIS — I10 Essential (primary) hypertension: Secondary | ICD-10-CM | POA: Diagnosis not present

## 2017-09-10 DIAGNOSIS — Z87891 Personal history of nicotine dependence: Secondary | ICD-10-CM | POA: Diagnosis not present

## 2017-09-10 DIAGNOSIS — F419 Anxiety disorder, unspecified: Secondary | ICD-10-CM | POA: Diagnosis not present

## 2017-09-10 DIAGNOSIS — E7800 Pure hypercholesterolemia, unspecified: Secondary | ICD-10-CM | POA: Diagnosis not present

## 2017-09-20 DIAGNOSIS — M5030 Other cervical disc degeneration, unspecified cervical region: Secondary | ICD-10-CM | POA: Diagnosis not present

## 2017-09-20 DIAGNOSIS — M5412 Radiculopathy, cervical region: Secondary | ICD-10-CM | POA: Diagnosis not present

## 2017-09-22 ENCOUNTER — Other Ambulatory Visit: Payer: Self-pay | Admitting: Family Medicine

## 2017-09-22 NOTE — Telephone Encounter (Signed)
Electronic refill Last refill 07/22/17 #30/1 Last office visit 07/28/17

## 2017-09-23 NOTE — Telephone Encounter (Signed)
Medication phoned to pharmacy.  

## 2017-09-23 NOTE — Telephone Encounter (Signed)
Please call in.  Thanks.   

## 2017-09-29 DIAGNOSIS — M5412 Radiculopathy, cervical region: Secondary | ICD-10-CM | POA: Diagnosis not present

## 2017-09-29 DIAGNOSIS — Z79891 Long term (current) use of opiate analgesic: Secondary | ICD-10-CM | POA: Diagnosis not present

## 2017-09-29 DIAGNOSIS — M5030 Other cervical disc degeneration, unspecified cervical region: Secondary | ICD-10-CM | POA: Diagnosis not present

## 2017-09-29 DIAGNOSIS — M545 Low back pain: Secondary | ICD-10-CM | POA: Diagnosis not present

## 2017-10-09 DIAGNOSIS — M5412 Radiculopathy, cervical region: Secondary | ICD-10-CM | POA: Diagnosis not present

## 2017-10-13 DIAGNOSIS — G894 Chronic pain syndrome: Secondary | ICD-10-CM | POA: Diagnosis not present

## 2017-10-13 DIAGNOSIS — M542 Cervicalgia: Secondary | ICD-10-CM | POA: Diagnosis not present

## 2017-10-13 DIAGNOSIS — M5412 Radiculopathy, cervical region: Secondary | ICD-10-CM | POA: Diagnosis not present

## 2017-10-14 DIAGNOSIS — L570 Actinic keratosis: Secondary | ICD-10-CM | POA: Diagnosis not present

## 2017-10-14 DIAGNOSIS — T50905A Adverse effect of unspecified drugs, medicaments and biological substances, initial encounter: Secondary | ICD-10-CM | POA: Diagnosis not present

## 2017-11-10 DIAGNOSIS — G894 Chronic pain syndrome: Secondary | ICD-10-CM | POA: Diagnosis not present

## 2017-11-10 DIAGNOSIS — M542 Cervicalgia: Secondary | ICD-10-CM | POA: Diagnosis not present

## 2017-11-10 DIAGNOSIS — M5412 Radiculopathy, cervical region: Secondary | ICD-10-CM | POA: Diagnosis not present

## 2017-11-22 ENCOUNTER — Other Ambulatory Visit: Payer: Self-pay | Admitting: Family Medicine

## 2017-11-22 NOTE — Telephone Encounter (Signed)
Electronic refill request Last refill 09/23/17 #30/1 Last office visit 07/28/17

## 2017-11-22 NOTE — Telephone Encounter (Signed)
Sent. Thanks.   

## 2017-12-07 DIAGNOSIS — X501XXA Overexertion from prolonged static or awkward postures, initial encounter: Secondary | ICD-10-CM | POA: Diagnosis not present

## 2017-12-07 DIAGNOSIS — R200 Anesthesia of skin: Secondary | ICD-10-CM | POA: Diagnosis not present

## 2017-12-07 DIAGNOSIS — R531 Weakness: Secondary | ICD-10-CM | POA: Diagnosis not present

## 2017-12-07 DIAGNOSIS — M549 Dorsalgia, unspecified: Secondary | ICD-10-CM | POA: Diagnosis not present

## 2017-12-07 DIAGNOSIS — M25552 Pain in left hip: Secondary | ICD-10-CM | POA: Diagnosis not present

## 2017-12-07 DIAGNOSIS — M545 Low back pain: Secondary | ICD-10-CM | POA: Diagnosis not present

## 2017-12-07 DIAGNOSIS — M5126 Other intervertebral disc displacement, lumbar region: Secondary | ICD-10-CM | POA: Diagnosis not present

## 2017-12-08 DIAGNOSIS — G894 Chronic pain syndrome: Secondary | ICD-10-CM | POA: Diagnosis not present

## 2017-12-08 DIAGNOSIS — M542 Cervicalgia: Secondary | ICD-10-CM | POA: Diagnosis not present

## 2017-12-08 DIAGNOSIS — M5412 Radiculopathy, cervical region: Secondary | ICD-10-CM | POA: Diagnosis not present

## 2018-01-05 DIAGNOSIS — M5412 Radiculopathy, cervical region: Secondary | ICD-10-CM | POA: Diagnosis not present

## 2018-01-05 DIAGNOSIS — M5416 Radiculopathy, lumbar region: Secondary | ICD-10-CM | POA: Diagnosis not present

## 2018-01-05 DIAGNOSIS — G894 Chronic pain syndrome: Secondary | ICD-10-CM | POA: Diagnosis not present

## 2018-01-05 DIAGNOSIS — M542 Cervicalgia: Secondary | ICD-10-CM | POA: Diagnosis not present

## 2018-01-13 DIAGNOSIS — L03211 Cellulitis of face: Secondary | ICD-10-CM | POA: Diagnosis not present

## 2018-01-13 DIAGNOSIS — L0390 Cellulitis, unspecified: Secondary | ICD-10-CM | POA: Diagnosis not present

## 2018-01-17 ENCOUNTER — Other Ambulatory Visit: Payer: Self-pay

## 2018-01-17 ENCOUNTER — Inpatient Hospital Stay
Admission: EM | Admit: 2018-01-17 | Discharge: 2018-01-21 | DRG: 603 | Disposition: A | Discharge status: Home / Self Care | Payer: BLUE CROSS/BLUE SHIELD | Attending: Internal Medicine | Admitting: Internal Medicine

## 2018-01-17 ENCOUNTER — Encounter: Payer: Self-pay | Admitting: Family Medicine

## 2018-01-17 ENCOUNTER — Emergency Department: Payer: BLUE CROSS/BLUE SHIELD

## 2018-01-17 ENCOUNTER — Ambulatory Visit (INDEPENDENT_AMBULATORY_CARE_PROVIDER_SITE_OTHER): Payer: BLUE CROSS/BLUE SHIELD | Admitting: Family Medicine

## 2018-01-17 DIAGNOSIS — L0390 Cellulitis, unspecified: Secondary | ICD-10-CM | POA: Diagnosis not present

## 2018-01-17 DIAGNOSIS — Z885 Allergy status to narcotic agent status: Secondary | ICD-10-CM

## 2018-01-17 DIAGNOSIS — E785 Hyperlipidemia, unspecified: Secondary | ICD-10-CM | POA: Diagnosis not present

## 2018-01-17 DIAGNOSIS — L03211 Cellulitis of face: Principal | ICD-10-CM | POA: Diagnosis present

## 2018-01-17 DIAGNOSIS — E876 Hypokalemia: Secondary | ICD-10-CM | POA: Diagnosis present

## 2018-01-17 DIAGNOSIS — F17210 Nicotine dependence, cigarettes, uncomplicated: Secondary | ICD-10-CM | POA: Diagnosis present

## 2018-01-17 DIAGNOSIS — Z87442 Personal history of urinary calculi: Secondary | ICD-10-CM | POA: Diagnosis not present

## 2018-01-17 DIAGNOSIS — L089 Local infection of the skin and subcutaneous tissue, unspecified: Secondary | ICD-10-CM

## 2018-01-17 DIAGNOSIS — L0201 Cutaneous abscess of face: Secondary | ICD-10-CM | POA: Diagnosis not present

## 2018-01-17 DIAGNOSIS — Z888 Allergy status to other drugs, medicaments and biological substances status: Secondary | ICD-10-CM | POA: Diagnosis not present

## 2018-01-17 DIAGNOSIS — Z79899 Other long term (current) drug therapy: Secondary | ICD-10-CM | POA: Diagnosis not present

## 2018-01-17 DIAGNOSIS — F419 Anxiety disorder, unspecified: Secondary | ICD-10-CM | POA: Diagnosis present

## 2018-01-17 DIAGNOSIS — Z8249 Family history of ischemic heart disease and other diseases of the circulatory system: Secondary | ICD-10-CM

## 2018-01-17 DIAGNOSIS — R229 Localized swelling, mass and lump, unspecified: Secondary | ICD-10-CM | POA: Diagnosis not present

## 2018-01-17 DIAGNOSIS — I10 Essential (primary) hypertension: Secondary | ICD-10-CM | POA: Diagnosis not present

## 2018-01-17 DIAGNOSIS — F329 Major depressive disorder, single episode, unspecified: Secondary | ICD-10-CM | POA: Diagnosis not present

## 2018-01-17 HISTORY — DX: Calculus of kidney: N20.0

## 2018-01-17 LAB — COMPREHENSIVE METABOLIC PANEL
ALBUMIN: 4.2 g/dL (ref 3.5–5.0)
ALT: 43 U/L (ref 17–63)
ANION GAP: 11 (ref 5–15)
AST: 25 U/L (ref 15–41)
Alkaline Phosphatase: 87 U/L (ref 38–126)
BUN: 15 mg/dL (ref 6–20)
CHLORIDE: 98 mmol/L — AB (ref 101–111)
CO2: 25 mmol/L (ref 22–32)
Calcium: 9.2 mg/dL (ref 8.9–10.3)
Creatinine, Ser: 0.81 mg/dL (ref 0.61–1.24)
GFR calc Af Amer: >60 mL/min (ref >60)
GFR calc non Af Amer: >60 mL/min (ref >60)
GLUCOSE: 131 mg/dL — AB (ref 65–99)
POTASSIUM: 3.3 mmol/L — AB (ref 3.5–5.1)
SODIUM: 134 mmol/L — AB (ref 135–145)
TOTAL PROTEIN: 7.4 g/dL (ref 6.5–8.1)
Total Bilirubin: 0.5 mg/dL (ref 0.3–1.2)

## 2018-01-17 LAB — CBC WITH DIFFERENTIAL/PLATELET
BASOS ABS: 0.1 10*3/uL (ref 0–0.1)
Basophils Relative: 1 %
Eosinophils Absolute: 0.4 10*3/uL (ref 0–0.7)
Eosinophils Relative: 4 %
HEMATOCRIT: 43.2 % (ref 40.0–52.0)
Hemoglobin: 15.3 g/dL (ref 13.0–18.0)
Lymphocytes Relative: 28 %
Lymphs Abs: 2.7 10*3/uL (ref 1.0–3.6)
MCH: 30 pg (ref 26.0–34.0)
MCHC: 35.4 g/dL (ref 32.0–36.0)
MCV: 84.9 fL (ref 80.0–100.0)
MONO ABS: 0.6 10*3/uL (ref 0.2–1.0)
MONOS PCT: 6 %
NEUTROS ABS: 5.9 10*3/uL (ref 1.4–6.5)
Neutrophils Relative %: 61 %
Platelets: 310 10*3/uL (ref 150–440)
RBC: 5.09 MIL/uL (ref 4.40–5.90)
RDW: 13.3 % (ref 11.5–14.5)
WBC: 9.7 10*3/uL (ref 3.8–10.6)

## 2018-01-17 MED ORDER — ATORVASTATIN CALCIUM 20 MG PO TABS
40.0000 mg | ORAL_TABLET | Freq: Every day | ORAL | Status: DC
  Administered 2018-01-18 – 2018-01-20 (×3): 40 mg via ORAL
  Filled 2018-01-17 (×3): qty 2

## 2018-01-17 MED ORDER — OXYCODONE HCL 5 MG PO TABS
5.0000 mg | ORAL_TABLET | ORAL | Status: DC | PRN
  Administered 2018-01-17 – 2018-01-18 (×2): 5 mg via ORAL
  Filled 2018-01-17 (×2): qty 1

## 2018-01-17 MED ORDER — LISINOPRIL 10 MG PO TABS
15.0000 mg | ORAL_TABLET | Freq: Every day | ORAL | Status: DC
  Administered 2018-01-18 – 2018-01-20 (×3): 15 mg via ORAL
  Filled 2018-01-17 (×3): qty 2

## 2018-01-17 MED ORDER — DOCUSATE SODIUM 100 MG PO CAPS: 100.0000 mg | ORAL_CAPSULE | Freq: Two times a day (BID) | ORAL | Status: DC | PRN

## 2018-01-17 MED ORDER — MORPHINE SULFATE (PF) 4 MG/ML IV SOLN
4.0000 mg | Freq: Once | INTRAVENOUS | Status: AC
  Administered 2018-01-17: 4 mg via INTRAVENOUS

## 2018-01-17 MED ORDER — ACETAMINOPHEN 325 MG PO TABS
650.0000 mg | ORAL_TABLET | Freq: Four times a day (QID) | ORAL | Status: DC | PRN
  Administered 2018-01-18: 650 mg via ORAL
  Filled 2018-01-17: qty 2

## 2018-01-17 MED ORDER — VANCOMYCIN HCL 10 G IV SOLR
1250.0000 mg | Freq: Three times a day (TID) | INTRAVENOUS | Status: DC
  Administered 2018-01-18 – 2018-01-19 (×4): 1250 mg via INTRAVENOUS
  Filled 2018-01-17 (×7): qty 1250

## 2018-01-17 MED ORDER — HEPARIN SODIUM (PORCINE) 5000 UNIT/ML IJ SOLN
5000.0000 [IU] | Freq: Three times a day (TID) | INTRAMUSCULAR | Status: DC
  Administered 2018-01-18 – 2018-01-21 (×10): 5000 [IU] via SUBCUTANEOUS
  Filled 2018-01-17 (×10): qty 1

## 2018-01-17 MED ORDER — AMLODIPINE BESYLATE 10 MG PO TABS
10.0000 mg | ORAL_TABLET | Freq: Every day | ORAL | Status: DC
  Administered 2018-01-18 – 2018-01-20 (×3): 10 mg via ORAL
  Filled 2018-01-17 (×3): qty 1

## 2018-01-17 MED ORDER — NICOTINE 21 MG/24HR TD PT24
21.0000 mg | MEDICATED_PATCH | Freq: Every day | TRANSDERMAL | Status: DC
  Administered 2018-01-17 – 2018-01-20 (×4): 21 mg via TRANSDERMAL
  Filled 2018-01-17 (×4): qty 1

## 2018-01-17 MED ORDER — VANCOMYCIN HCL IN DEXTROSE 1-5 GM/200ML-% IV SOLN
1000.0000 mg | Freq: Once | INTRAVENOUS | Status: AC
  Administered 2018-01-17: 1000 mg via INTRAVENOUS
  Filled 2018-01-17: qty 200

## 2018-01-17 MED ORDER — CLONAZEPAM 0.125 MG PO TBDP
0.2500 mg | ORAL_TABLET | Freq: Three times a day (TID) | ORAL | Status: DC | PRN
  Administered 2018-01-19: 0.25 mg via ORAL
  Filled 2018-01-17: qty 2

## 2018-01-17 MED ORDER — MORPHINE SULFATE (PF) 4 MG/ML IV SOLN
INTRAVENOUS | Status: AC
  Filled 2018-01-17: qty 1

## 2018-01-17 MED ORDER — ONDANSETRON HCL 4 MG/2ML IJ SOLN
4.0000 mg | Freq: Once | INTRAMUSCULAR | Status: AC
  Administered 2018-01-17: 4 mg via INTRAVENOUS
  Filled 2018-01-17: qty 2

## 2018-01-17 MED ORDER — SODIUM CHLORIDE 0.9 % IV SOLN
2.0000 g | Freq: Two times a day (BID) | INTRAVENOUS | Status: DC
  Administered 2018-01-17 – 2018-01-20 (×7): 2 g via INTRAVENOUS
  Filled 2018-01-17 (×9): qty 2

## 2018-01-17 MED ORDER — IOPAMIDOL (ISOVUE-370) INJECTION 76%
75.0000 mL | Freq: Once | INTRAVENOUS | Status: AC | PRN
  Administered 2018-01-17: 60 mL via INTRAVENOUS

## 2018-01-17 MED ORDER — MORPHINE SULFATE (PF) 4 MG/ML IV SOLN
4.0000 mg | Freq: Once | INTRAVENOUS | Status: AC
  Administered 2018-01-17: 4 mg via INTRAVENOUS
  Filled 2018-01-17: qty 1

## 2018-01-17 MED ORDER — SODIUM CHLORIDE 0.9 % IV BOLUS
1000.0000 mL | Freq: Once | INTRAVENOUS | Status: AC
  Administered 2018-01-17: 1000 mL via INTRAVENOUS

## 2018-01-17 NOTE — ED Provider Notes (Signed)
[facility] Emergency Department Provider Note  Time seen: 7:09 PM  I have reviewed the triage vital signs and the nursing notes.   HISTORY  Chief Complaint cellulitis    HPI Eric Wells is a 47 y.o. male with a past medical history of anxiety, kidney stones, presents to the emergency department for facial pain swelling and fever.  According to the patient approximately 1 week ago he developed a small bump to his left face.  States he scraped there popped the bump, but over the next day or 2 it started getting more red and inflamed and painful.  Patient went to his doctor 5 days ago was seen and diagnosed with cellulitis.  Was given a shot of Rocephin in the office and discharged with Septra and minocycline.  Patient states he has been taking his antibiotics for the past 4 days, but feels over the past 24 to 48 hours the redness pain and swelling has started to enlarge once again.  Denies any nausea or vomiting.  Largely negative review of systems.  States he has had intermittent fevers, subjective has not measured her temperature.   Past Medical History:  Diagnosis Date  . Anxiety   . Depression 04/2008   Hosp., suicide risk  . Femur fracture ([place]) 1991   Left  . Hematuria 2011   Eval with Uro  . Kidney stones   . Nephrolithiasis    Dr. Terance Hart with Uro  . Tendon laceration 1993   Tricep    Patient Active Problem List   Diagnosis Date Noted  . Facial infection 01/17/2018  . Skin lesion 07/29/2017  . Encounter for general adult medical examination with abnormal findings 04/08/2017  . Advance care planning 04/08/2017  . Neck pain 04/08/2017  . HTN (hypertension) 02/08/2014  . Lipoma 07/24/2013  . Pain in joint, ankle and foot 05/12/2013  . Erectile dysfunction 05/12/2013  . Smoking 11/30/2011  . Groin mass 07/05/2011  . Headache(784.0) 04/29/2011  . CHRONIC OBSTRUCTIVE PULMONARY DISEASE, ACUTE EXACERBATION 02/11/2009  . PREDIABETES  08/06/2008  . PURE HYPERCHOLESTEROLEMIA 08/03/2008  . Anxiety state 07/27/2008  . Calculus of kidney 07/27/2008    Past Surgical History:  Procedure Laterality Date  . CERVICAL SPINE SURGERY  2015   CERVICAL DISCETOMY POSTERIOR C5 TO 6 FUSION WITH INSTRUMENTATION  . CERVICAL SPINE SURGERY    . FEMUR SURGERY  1990  . KIDNEY STONE SURGERY  2000  . LITHOTRIPSY  2011  . [place]    Prior to Admission medications   Medication Sig Start Date End Date Taking? Authorizing Provider  amLODipine (NORVASC) 10 MG tablet Take 1 tablet (10 mg total) by mouth daily. 04/07/17   Tonia Ghent, MD  atorvastatin (LIPITOR) 20 MG tablet Take 2 tablets (40 mg total) by mouth daily. 06/14/17   Tonia Ghent, MD  clonazePAM (KLONOPIN) 1 MG tablet TAKE ONE-HALF TO 1 TABLET DAILY AS NEEDED FOR ANXIETY 11/22/17   Tonia Ghent, MD  lisinopril (PRINIVIL,ZESTRIL) 10 MG tablet Take 1.5 tablets (15 mg total) by mouth daily. 04/07/17   Tonia Ghent, MD  minocycline (MINOCIN,DYNACIN) 50 MG capsule Take 50 mg by mouth 2 (two) times daily.    [provider]  oxyCODONE (OXY IR/ROXICODONE) 5 MG immediate release tablet Take by mouth. Take 1-2 by mouth every 4-6 hours    [provider]  sildenafil (VIAGRA) 100 MG tablet TAKE 1/2 TO 1 TABLET BY MOUTH DAILY AS NEEDED FOR ERECTILE  DYSFUNCTION 08/25/17   Tonia Ghent, MD  sulfamethoxazole-trimethoprim (BACTRIM DS,SEPTRA DS) 800-160 MG tablet Take 1 tablet by mouth 2 (two) times daily.    [provider]    Allergies  Allergen Reactions  . Cymbalta [Duloxetine Hcl] Other (See Comments)    Inc in anxiety/jittery  . Hydromorphone Hcl     REACTION: Itch, rash  . Varenicline Tartrate     REACTION: abnormal dreams    Family History  Problem Relation Age of Onset  . Heart disease Father        MI  . Heart disease Maternal Grandmother        CHF  . Cancer Maternal Grandfather        Colon CA  . Colon cancer  Maternal Grandfather   . Cancer Paternal Grandmother        Breast CA  . Cancer Paternal Grandfather        Lung CA  . Prostate cancer Neg Hx     Social History Social History   Tobacco Use  . Smoking status: Current Some Day Smoker    Packs/day: 1.00    Years: 20.00    Pack years: 20.00    Types: Cigarettes  . Smokeless tobacco: Never Used  Substance Use Topics  . Alcohol use: Yes    Alcohol/week: 0.0 oz    Comment: Rare  . Drug use: No    Review of Systems Constitutional: Subjective fever at home. Eyes: Negative for visual complaints ENT: Positive for left facial pain and swelling Cardiovascular: Negative for chest pain. Respiratory: Negative for shortness of breath. Gastrointestinal: Negative for abdominal pain Genitourinary: Negative for urinary compaints Musculoskeletal: Negative for musculoskeletal complaints Skin: Positive for left facial pain and swelling with erythema to the left face. Neurological: Negative for headache All other ROS negative  ____________________________________________   PHYSICAL EXAM:  VITAL SIGNS: ED Triage Vitals  Enc Vitals Group     BP 01/17/18 1633 (!) 161/88     Pulse Rate 01/17/18 1633 75     Resp 01/17/18 1633 18     Temp 01/17/18 1633 99.2 F (37.3 C)     Temp Source 01/17/18 1633 Oral     SpO2 01/17/18 1633 95 %     Weight 01/17/18 1633 192 lb (87.1 kg)     Height 01/17/18 1633 5\' 9"  (1.753 m)     Head Circumference --      Peak Flow --      Pain Score 01/17/18 1645 6     Pain Loc --      Pain Edu? --      Excl. in [place]? --     Constitutional: Alert and oriented. Well appearing and in no distress. Eyes: Normal exam ENT   Head: Patient has moderate induration approximately 4 to 5 cm in diameter around the left face/left cheek with a small wound that appears to be crusted over.  This area is erythematous and very tender to palpation.  Extends just below the submandibular line.  Floor the mouth is soft.  The  remainder of the neck is soft and nontender.   Mouth/Throat: Mucous membranes are moist. Cardiovascular: Normal rate, regular rhythm. No murmur Respiratory: Normal respiratory effort without tachypnea nor retractions. Breath sounds are clear  Gastrointestinal: Soft and nontender. No distention.   Musculoskeletal: Nontender with normal range of motion in all extremities. No lower extremity tenderness or edema. Neurologic:  Normal speech and language. No gross focal neurologic deficits are appreciated. Skin:  Skin is warm, dry and intact.  Psychiatric: Mood and affect are normal. Speech and behavior are normal.   ____________________________________________    RADIOLOGY  CT neg for abscess, consistent with cellulitis  ____________________________________________   INITIAL IMPRESSION / ASSESSMENT AND PLAN / ED COURSE  Pertinent labs & imaging results that were available during my care of the patient were reviewed by me and considered in my medical decision making (see chart for details).  Vision presented to the emergency department for worsening redness and swelling of his left face despite antibiotics at home.  Differential would include cellulitis, abscess.  Labs are largely within normal limits including a normal white blood cell count of 9.7.  Will obtain CT imaging to further evaluate.  However as the patient has been on 2 oral antibiotics for the past 4 days and has had worsening swelling redness and pain along with subjective fever I anticipate likely admission to the hospital for IV antibiotics for facial cellulitis.  I discussed this plan of care with the patient who is agreeable.   CT negative for abscess.  Consistent with cellulitis.  Given the failure of outpatient therapy we will treat with IV antibiotics and admit to the hospital service. ____________________________________________   FINAL CLINICAL IMPRESSION(S) / ED DIAGNOSES  Facial cellulitis    Harvest Dark, MD 01/17/18 2108

## 2018-01-17 NOTE — Progress Notes (Signed)
Pharmacy Antibiotic Note  Eric Wells is a 47 y.o. male admitted on 01/17/2018 with cellulitis.  Pharmacy has been consulted for vancomycin and cefepime dosing.  Plan: DE 87kg  Vd 61L kei 0.1 hr-1  T1/2 7 hours  Vancomycin 1250 mg q 8 hours ordered with stacked dosing. Level before 5th dose. Goal trough 15-20.  Cefepime 2 grams q 12 hours ordered.  Height: 5\' 9"  (175.3 cm) Weight: 192 lb (87.1 kg) IBW/kg (Calculated) : 70.7  Temp (24hrs), Avg:98.9 F (37.2 C), Min:98.6 F (37 C), Max:99.2 F (37.3 C)  Recent Labs  Lab 01/17/18 1657  WBC 9.7  CREATININE 0.81    Estimated Creatinine Clearance: 123.3 mL/min (by C-G formula based on SCr of 0.81 mg/dL).    Allergies  Allergen Reactions  . Cymbalta [Duloxetine Hcl] Other (See Comments)    Inc in anxiety/jittery  . Hydromorphone Hcl     REACTION: Itch, rash  . Varenicline Tartrate     REACTION: abnormal dreams    Antimicrobials this admission: Vancomycin 4/29, cefepime 4/30   >>    >>   Dose adjustments this admission:   Microbiology results: 4/29 BCx: pending  Thank you for allowing pharmacy to be a part of this patient's care.  Eric Wells S 01/17/2018 10:14 PM

## 2018-01-17 NOTE — Patient Instructions (Signed)
Go to [facility] ER.  We'll call ahead.  Likely either abscess vs cellulitis on the L cheek.  Take care.  Glad to see you.

## 2018-01-17 NOTE — Progress Notes (Signed)
He had surgery 08/2017 with Dr. Sharyne Richters. It helped with the pain overall.  He is weaning off pain medicine.    L facial sx started about 01/11/18.  Initially drained some.  He was seen at [facility] 01/13/18, given IM rocephin.  Started on septra and minocycline.  not better in the meantime, more puffy locally.  Fever last week, not in the last 1-2 days.  No vomiting.  Some diarrhea while on abx.  Can still swallow, chew.  More pain if the area is dependent, ie leaning forward.    Smoking a few cigs a day, "maybe 1 a day", less than prev.  Is vaping in the meantime.    Meds, vitals, and allergies reviewed.   ROS: Per HPI unless specifically indicated in ROS section   nad TM wnl Nasal and OP exam wnl, teeth not ttp Marked L facial swelling with either abscess vs cellulitis on the L cheek.  No LA noted. rrr

## 2018-01-17 NOTE — ED Notes (Signed)
Pt complains of left sided cellulitis on face. Pt states he went to clinic and received a shot of rocephin and sent home on PO antibiotics. Pt states the swelling on left side of face has gotten worse in the last couple of days and his neck is feeling different. Pt states that he didn't know if IV antibiotics would be better or not. AxOx4. No N/V.

## 2018-01-17 NOTE — ED Triage Notes (Signed)
Pt c/o a sore coming up on the left left corner of his mouth last week and picked at it, states on Thursday he had swelling on that side of his face and chin and was seen at urgent care and given a shot of rocephin and Rx abx, states it seems to be getting worse instead of better. Denies fever. Mild swelling noted on arrival..

## 2018-01-17 NOTE — Assessment & Plan Note (Signed)
Likely either abscess vs cellulitis on the L cheek.  Failed outpatient rx.  Likely will need CT vs ENT eval.   May end up needing IV abx.   Advised to go to ER.  We'll call ahead.

## 2018-01-17 NOTE — H&P (Signed)
[facility] [place] NAME: Eric Wells    MR#:  [id number]  DATE OF BIRTH:  [birth year]  DATE OF ADMISSION:  01/17/2018  PRIMARY CARE PHYSICIAN: Tonia Ghent, MD   REQUESTING/REFERRING PHYSICIAN: [place]  CHIEF COMPLAINT:   Chief Complaint  Patient presents with  . cellulitis    HISTORY OF PRESENT ILLNESS: Eric Wells  is a 47 y.o. male with a known history of anxiety, depression, kidney stones- started having swelling on his left side jaw and upper neck for last 4 days, went to urgent care Center where they gave him 1 injection ceftriaxone and gave him oral Bactrim and sent home. Since then he is taking oral Bactrim for last 3 days. But his symptoms are getting worse his swelling is extending to his left cheek, and lower face, so he came to emergency room for further management. CT scan as well facial did not show any localized abscess but because of failure of outpatient oral antibiotic therapy ER physician suggested to admit and give IV antibiotic therapy.  PAST MEDICAL HISTORY:   Past Medical History:  Diagnosis Date  . Anxiety   . Depression 04/2008   Hosp., suicide risk  . Femur fracture ([place]) 1991   Left  . Hematuria 2011   Eval with Uro  . Kidney stones   . Nephrolithiasis    Dr. Terance Hart with Uro  . Tendon laceration 1993   Tricep    PAST SURGICAL HISTORY:  Past Surgical History:  Procedure Laterality Date  . CERVICAL SPINE SURGERY  2015   CERVICAL DISCETOMY POSTERIOR C5 TO 6 FUSION WITH INSTRUMENTATION  . CERVICAL SPINE SURGERY    . FEMUR SURGERY  1990  . KIDNEY STONE SURGERY  2000  . LITHOTRIPSY  2011  . TRICEPS TENDON REPAIR  1993    SOCIAL HISTORY:  Social History   Tobacco Use  . Smoking status: Current Some Day Smoker    Packs/day: 1.00    Years: 20.00    Pack years: 20.00    Types: Cigarettes  . Smokeless tobacco: Never Used  Substance Use Topics  . Alcohol use: Yes    Alcohol/week: 0.0 oz   Comment: Rare    FAMILY HISTORY:  Family History  Problem Relation Age of Onset  . Heart disease Father        MI  . Heart disease Maternal Grandmother        CHF  . Cancer Maternal Grandfather        Colon CA  . Colon cancer Maternal Grandfather   . Cancer Paternal Grandmother        Breast CA  . Cancer Paternal Grandfather        Lung CA  . Prostate cancer Neg Hx     DRUG ALLERGIES:  Allergies  Allergen Reactions  . Cymbalta [Duloxetine Hcl] Other (See Comments)    Inc in anxiety/jittery  . Hydromorphone Hcl     REACTION: Itch, rash  . Varenicline Tartrate     REACTION: abnormal dreams    REVIEW OF SYSTEMS:   CONSTITUTIONAL: No fever, fatigue or weakness.  EYES: No blurred or double vision.  EARS, NOSE, AND THROAT: No tinnitus or ear pain. Cellulitis and swelling on left lower face and upper throat. RESPIRATORY: No cough, shortness of breath, wheezing or hemoptysis.  CARDIOVASCULAR: No chest pain, orthopnea, edema.  GASTROINTESTINAL: No nausea, vomiting, diarrhea or abdominal pain.  GENITOURINARY: No dysuria, hematuria.  ENDOCRINE: No polyuria, nocturia,  HEMATOLOGY: No anemia, easy bruising or bleeding SKIN: No rash or lesion. MUSCULOSKELETAL: No joint pain or arthritis.   NEUROLOGIC: No tingling, numbness, weakness.  PSYCHIATRY: No anxiety or depression.   MEDICATIONS AT HOME:  Prior to Admission medications   Medication Sig Start Date End Date Taking? Authorizing Provider  amLODipine (NORVASC) 10 MG tablet Take 1 tablet (10 mg total) by mouth daily. 04/07/17  Yes Tonia Ghent, MD  atorvastatin (LIPITOR) 20 MG tablet Take 2 tablets (40 mg total) by mouth daily. 06/14/17  Yes Tonia Ghent, MD  clonazePAM (KLONOPIN) 1 MG tablet TAKE ONE-HALF TO 1 TABLET DAILY AS NEEDED FOR ANXIETY 11/22/17  Yes Tonia Ghent, MD  lisinopril (PRINIVIL,ZESTRIL) 10 MG tablet Take 1.5 tablets (15 mg total) by mouth daily. 04/07/17  Yes Tonia Ghent, MD  minocycline  (MINOCIN,DYNACIN) 50 MG capsule Take 50 mg by mouth 2 (two) times daily.   Yes [provider]  oxyCODONE (OXY IR/ROXICODONE) 5 MG immediate release tablet Take 5 mg by mouth every 4 (four) hours as needed. Take 1-2 by mouth every 4-6 hours    Yes [provider]  sildenafil (VIAGRA) 100 MG tablet TAKE 1/2 TO 1 TABLET BY MOUTH DAILY AS NEEDED FOR ERECTILE DYSFUNCTION 08/25/17  Yes Tonia Ghent, MD  sulfamethoxazole-trimethoprim (BACTRIM DS,SEPTRA DS) 800-160 MG tablet Take 1 tablet by mouth 2 (two) times daily.   Yes [provider]      PHYSICAL EXAMINATION:   VITAL SIGNS: Blood pressure (!) 142/80, pulse 66, temperature 99.2 F (37.3 C), temperature source Oral, resp. rate 18, height 5\' 9"  (1.753 m), weight 87.1 kg (192 lb), SpO2 97 %.  GENERAL:  47 y.o.-year-old patient lying in the bed with no acute distress.  EYES: Pupils equal, round, reactive to light and accommodation. No scleral icterus. Extraocular muscles intact.  HEENT: Head atraumatic, normocephalic. Oropharynx and nasopharynx clear. Left side lower face and upper throat are swollen and tender.    On the left side upper first molar has some decaying. NECK:  Supple, no jugular venous distention. No thyroid enlargement, no tenderness.  LUNGS: Normal breath sounds bilaterally, no wheezing, rales,rhonchi or crepitation. No use of accessory muscles of respiration.  CARDIOVASCULAR: S1, S2 normal. No murmurs, rubs, or gallops.  ABDOMEN: Soft, nontender, nondistended. Bowel sounds present. No organomegaly or mass.  EXTREMITIES: No pedal edema, cyanosis, or clubbing.  NEUROLOGIC: Cranial nerves II through XII are intact. Muscle strength 5/5 in all extremities. Sensation intact. Gait not checked.  PSYCHIATRIC: The patient is alert and oriented x 3.  SKIN: No obvious rash, lesion, or ulcer.   LABORATORY PANEL:   CBC Recent Labs  Lab 01/17/18 1657  WBC 9.7  HGB 15.3  HCT 43.2  PLT 310  MCV 84.9  MCH  30.0  MCHC 35.4  RDW 13.3  LYMPHSABS 2.7  MONOABS 0.6  EOSABS 0.4  BASOSABS 0.1   ------------------------------------------------------------------------------------------------------------------  Chemistries  Recent Labs  Lab 01/17/18 1657  NA 134*  K 3.3*  CL 98*  CO2 25  GLUCOSE 131*  BUN 15  CREATININE 0.81  CALCIUM 9.2  AST 25  ALT 43  ALKPHOS 87  BILITOT 0.5   ------------------------------------------------------------------------------------------------------------------ estimated creatinine clearance is 123.3 mL/min (by C-G formula based on SCr of 0.81 mg/dL). ------------------------------------------------------------------------------------------------------------------ No results for input(s): TSH, T4TOTAL, T3FREE, THYROIDAB in the last 72 hours.  Invalid input(s): FREET3   Coagulation profile No results for input(s): INR, PROTIME in the last 168 hours. ------------------------------------------------------------------------------------------------------------------- No results  for input(s): DDIMER in the last 72 hours. -------------------------------------------------------------------------------------------------------------------  Cardiac Enzymes No results for input(s): CKMB, TROPONINI, MYOGLOBIN in the last 168 hours.  Invalid input(s): CK ------------------------------------------------------------------------------------------------------------------ Invalid input(s): POCBNP  ---------------------------------------------------------------------------------------------------------------  Urinalysis    Component Value Date/Time   COLORURINE orange 11/06/2009 1559   APPEARANCEUR Cloudy 11/06/2009 1559   LABSPEC >=1.030 11/06/2009 1559   PHURINE 6.0 11/06/2009 1559   HGBUR large 11/06/2009 1559   BILIRUBINUR negative 11/06/2009 1559   UROBILINOGEN 0.2 11/06/2009 1559   NITRITE negative 11/06/2009 1559     RADIOLOGY: Ct Maxillofacial W  Contrast  Result Date: 01/17/2018 CLINICAL DATA:  47 y/o  M; worsening left facial swelling. EXAM: CT MAXILLOFACIAL WITH CONTRAST TECHNIQUE: Multidetector CT imaging of the maxillofacial structures was performed with intravenous contrast. Multiplanar CT image reconstructions were also generated. CONTRAST:  32mL ISOVUE-370 IOPAMIDOL (ISOVUE-370) INJECTION 76% COMPARISON:  None. FINDINGS: Osseous: No fracture or mandibular dislocation. No destructive process. Orbits: Negative. No traumatic or inflammatory finding. Sinuses: Opacification of the right middle ear cavity. Normal aeration of left mastoid air cells and middle ear cavity. Mild diffuse paranasal sinus mucosal thickening. Soft tissues: Inflammation of dermal and subdermal soft tissues overlying the body of the mandible and extending to the left cheek. No rim enhancing fluid collection. Limited intracranial: No significant or unexpected finding. IMPRESSION: Superficial left facial soft tissue inflammation overlying left body of mandible extending to left cheek compatible with cellulitis. No abscess. No extension of inflammation to deep facial or cervical compartments. Electronically Signed   By: Kristine Garbe M.D.   On: 01/17/2018 20:42    EKG: No orders found for this or any previous visit.  IMPRESSION AND PLAN:  * cellulitis of face- failed outpatient oral therapy   IV vancomycin and cefepime for now.   We may switch to oral soon once his symptoms are getting better.  * hypertension   Continue home medication of amlodipine and lisinopril.  * hyperlipidemia   Continue atorvastatin.  * anxiety   Continue clonazepam.  * hypokalemia   Monitor for now.  * active smoking   Counseled to quit smoking for 4 minutes and offered nicotine patch.  All the records are reviewed and case discussed with ED provider. Management plans discussed with the patient, family and they are in agreement.  CODE STATUS: Full.    TOTAL TIME  TAKING CARE OF THIS PATIENT: 45 minutes.    Vaughan Basta M.D on 01/17/2018   Between 7am to 6pm - Pager - [phone]  After 6pm go to [email or web address] - password [email or web address]  Office  [phone]  CC: Primary care physician; Tonia Ghent, MD   Note: This dictation was prepared with Dragon dictation along with smaller phrase technology. Any transcriptional errors that result from this process are unintentional.

## 2018-01-18 LAB — BASIC METABOLIC PANEL
Anion gap: 7 (ref 5–15)
BUN: 13 mg/dL (ref 6–20)
CALCIUM: 8.6 mg/dL — AB (ref 8.9–10.3)
CO2: 25 mmol/L (ref 22–32)
Chloride: 106 mmol/L (ref 101–111)
Creatinine, Ser: 0.67 mg/dL (ref 0.61–1.24)
GFR calc non Af Amer: >60 mL/min (ref >60)
Glucose, Bld: 112 mg/dL — ABNORMAL HIGH (ref 65–99)
Potassium: 3.9 mmol/L (ref 3.5–5.1)
SODIUM: 138 mmol/L (ref 135–145)

## 2018-01-18 LAB — CBC
HCT: 40.5 % (ref 40.0–52.0)
Hemoglobin: 14 g/dL (ref 13.0–18.0)
MCH: 29.6 pg (ref 26.0–34.0)
MCHC: 34.5 g/dL (ref 32.0–36.0)
MCV: 85.8 fL (ref 80.0–100.0)
PLATELETS: 287 10*3/uL (ref 150–440)
RBC: 4.72 MIL/uL (ref 4.40–5.90)
RDW: 13.6 % (ref 11.5–14.5)
WBC: 8.9 10*3/uL (ref 3.8–10.6)

## 2018-01-18 MED ORDER — MORPHINE SULFATE (PF) 4 MG/ML IV SOLN
4.0000 mg | INTRAVENOUS | Status: DC | PRN
  Administered 2018-01-18 – 2018-01-21 (×16): 4 mg via INTRAVENOUS
  Filled 2018-01-18 (×16): qty 1

## 2018-01-18 MED ORDER — CALCIUM CARBONATE ANTACID 500 MG PO CHEW
1.0000 | CHEWABLE_TABLET | ORAL | Status: DC | PRN
  Administered 2018-01-18 – 2018-01-20 (×7): 200 mg via ORAL
  Filled 2018-01-18 (×7): qty 1

## 2018-01-18 MED ORDER — OXYCODONE HCL 5 MG PO TABS
5.0000 mg | ORAL_TABLET | ORAL | Status: DC | PRN
  Administered 2018-01-18 – 2018-01-19 (×2): 5 mg via ORAL
  Filled 2018-01-18 (×2): qty 1

## 2018-01-18 MED ORDER — OXYCODONE HCL 5 MG PO TABS
10.0000 mg | ORAL_TABLET | ORAL | Status: DC | PRN
  Administered 2018-01-18 – 2018-01-21 (×13): 10 mg via ORAL
  Filled 2018-01-18 (×14): qty 2

## 2018-01-18 NOTE — Plan of Care (Signed)
  Problem: Pain Managment: Goal: General experience of comfort will improve Outcome: Progressing   Problem: Skin Integrity: Goal: Risk for impaired skin integrity will decrease Outcome: Progressing   

## 2018-01-18 NOTE — Progress Notes (Signed)
[facility] [place] NAME: Eric Wells    MR#:  [id number]  DATE OF BIRTH:  [birth year]  SUBJECTIVE:  CHIEF COMPLAINT:   Chief Complaint  Patient presents with  . cellulitis   Still facial swelling and pain on the left side. REVIEW OF SYSTEMS:  Review of Systems  Constitutional: Negative for chills, fever and malaise/fatigue.  HENT: Negative for sore throat.        Still facial swelling and pain on the left side.  Eyes: Negative for blurred vision and double vision.  Respiratory: Negative for cough, hemoptysis, shortness of breath, wheezing and stridor.   Cardiovascular: Negative for chest pain, palpitations, orthopnea and leg swelling.  Gastrointestinal: Negative for abdominal pain, blood in stool, diarrhea, melena, nausea and vomiting.  Genitourinary: Negative for dysuria, flank pain and hematuria.  Musculoskeletal: Negative for back pain and joint pain.  Skin: Negative for rash.  Neurological: Negative for dizziness, sensory change, focal weakness, seizures, loss of consciousness, weakness and headaches.  Endo/Heme/Allergies: Negative for polydipsia.  Psychiatric/Behavioral: Negative for depression. The patient is not nervous/anxious.     DRUG ALLERGIES:   Allergies  Allergen Reactions  . Cymbalta [Duloxetine Hcl] Other (See Comments)    Inc in anxiety/jittery  . Hydromorphone Hcl     REACTION: Itch, rash  . Varenicline Tartrate     REACTION: abnormal dreams   VITALS:  Blood pressure (!) 154/88, pulse 73, temperature 97.8 F (36.6 C), temperature source Oral, resp. rate 16, height 5\' 9"  (1.753 m), weight 198 lb 3.2 oz (89.9 kg), SpO2 96 %. PHYSICAL EXAMINATION:  Physical Exam  Constitutional: He is oriented to person, place, and time.  HENT:  Head: Normocephalic.  Mouth/Throat: Oropharynx is clear and moist.  Facial swelling and pain on the left side. Mouth swelling on left side.  Eyes: Pupils are equal, round, and  reactive to light. Conjunctivae and EOM are normal. No scleral icterus.  Neck: Normal range of motion. Neck supple. No JVD present. No tracheal deviation present.  Cardiovascular: Normal rate, regular rhythm and normal heart sounds. Exam reveals no gallop.  No murmur heard. Pulmonary/Chest: Effort normal and breath sounds normal. No respiratory distress. He has no wheezes. He has no rales.  Abdominal: Soft. Bowel sounds are normal. He exhibits no distension. There is no tenderness. There is no rebound.  Musculoskeletal: Normal range of motion. He exhibits no edema or tenderness.  Neurological: He is alert and oriented to person, place, and time. No cranial nerve deficit.  Skin: No rash noted. No erythema.   LABORATORY PANEL:  Male CBC Recent Labs  Lab 01/18/18 0306  WBC 8.9  HGB 14.0  HCT 40.5  PLT 287   ------------------------------------------------------------------------------------------------------------------ Chemistries  Recent Labs  Lab 01/17/18 1657 01/18/18 0306  NA 134* 138  K 3.3* 3.9  CL 98* 106  CO2 25 25  GLUCOSE 131* 112*  BUN 15 13  CREATININE 0.81 0.67  CALCIUM 9.2 8.6*  AST 25  --   ALT 43  --   ALKPHOS 87  --   BILITOT 0.5  --    RADIOLOGY:  Ct Maxillofacial W Contrast  Result Date: 01/17/2018 CLINICAL DATA:  47 y/o  M; worsening left facial swelling. EXAM: CT MAXILLOFACIAL WITH CONTRAST TECHNIQUE: Multidetector CT imaging of the maxillofacial structures was performed with intravenous contrast. Multiplanar CT image reconstructions were also generated. CONTRAST:  90mL ISOVUE-370 IOPAMIDOL (ISOVUE-370) INJECTION 76% COMPARISON:  None. FINDINGS: Osseous: No fracture or mandibular  dislocation. No destructive process. Orbits: Negative. No traumatic or inflammatory finding. Sinuses: Opacification of the right middle ear cavity. Normal aeration of left mastoid air cells and middle ear cavity. Mild diffuse paranasal sinus mucosal thickening. Soft tissues:  Inflammation of dermal and subdermal soft tissues overlying the body of the mandible and extending to the left cheek. No rim enhancing fluid collection. Limited intracranial: No significant or unexpected finding. IMPRESSION: Superficial left facial soft tissue inflammation overlying left body of mandible extending to left cheek compatible with cellulitis. No abscess. No extension of inflammation to deep facial or cervical compartments. Electronically Signed   By: Kristine Garbe M.D.   On: 01/17/2018 20:42   ASSESSMENT AND PLAN:   * cellulitis of face- failed outpatient oral therapy   Continue IV vancomycin and cefepime for now.   We may switch to oral soon once his symptoms are getting better.  * hypertension   Continue home medication of amlodipine and lisinopril.  * hyperlipidemia   Continue atorvastatin.  * anxiety   Continue clonazepam.  * hypokalemia   Monitor for now.  * active smoking   Counseled to quit smoking for 4 minutes and offered nicotine patch.  All the records are reviewed and case discussed with Care Management/Social Worker. Management plans discussed with the patient, family and they are in agreement.  CODE STATUS: Full Code  TOTAL TIME TAKING CARE OF THIS PATIENT: 32 minutes.   More than 50% of the time was spent in counseling/coordination of care: YES  POSSIBLE D/C IN 1-2 DAYS, DEPENDING ON CLINICAL CONDITION.   Demetrios Loll M.D on 01/18/2018 at 12:58 PM  Between 7am to 6pm - Pager - [phone]  After 6pm go to [email or web address] - [facility]

## 2018-01-18 NOTE — Assessment & Plan Note (Signed)
Go to [facility] ER.  We'll call ahead.  Likely either abscess vs cellulitis on the L cheek.  Will likely need CT with I&D vs IV abx.  dw pt.  He agrees.

## 2018-01-19 LAB — VANCOMYCIN, TROUGH: VANCOMYCIN TR: 12 ug/mL — AB (ref 15–20)

## 2018-01-19 LAB — HIV ANTIBODY (ROUTINE TESTING W REFLEX): HIV Screen 4th Generation wRfx: NONREACTIVE

## 2018-01-19 MED ORDER — VANCOMYCIN HCL 10 G IV SOLR
1500.0000 mg | Freq: Three times a day (TID) | INTRAVENOUS | Status: DC
  Administered 2018-01-19 – 2018-01-21 (×6): 1500 mg via INTRAVENOUS
  Filled 2018-01-19 (×10): qty 1500

## 2018-01-19 NOTE — Progress Notes (Signed)
Talked to Dr. Marcille Blanco about patient's notice that his left face cellulitis is more painful tonight, and the "bump" inside is more swollen. He also reports that when he was washing his face that there is "pus" on his gown when he wipes his face. RN checked inside of his mouth if there are any pus, but no drainage noted. Per MD if there are any pus this is to expect, no orders given. Patient's pain is manage with IV Morphine and Oxycodone PRN. No other concern at the moment. RN will continue to monitor.

## 2018-01-19 NOTE — Progress Notes (Signed)
Pharmacy Antibiotic Note  Fransisco Messmer is a 47 y.o. male admitted on 01/17/2018 with cellulitis.  Pharmacy has been consulted for vancomycin and cefepime dosing.  Plan: DE 87kg  Vd 61L kei 0.1 hr-1  T1/2 7 hours  Vancomycin 1250 mg q 8 hours ordered with stacked dosing. Level before 5th dose. Goal trough 15-20.  Cefepime 2 grams q 12 hours ordered.  Height: 5\' 9"  (175.3 cm) Weight: 198 lb 3.2 oz (89.9 kg) IBW/kg (Calculated) : 70.7  Temp (24hrs), Avg:98 F (36.7 C), Min:97.8 F (36.6 C), Max:98.3 F (36.8 C)  Recent Labs  Lab 01/17/18 1657 01/18/18 0306 01/19/18 0200  WBC 9.7 8.9  --   CREATININE 0.81 0.67  --   VANCOTROUGH  --   --  12*    Estimated Creatinine Clearance: 126.6 mL/min (by C-G formula based on SCr of 0.67 mg/dL).    Allergies  Allergen Reactions  . Cymbalta [Duloxetine Hcl] Other (See Comments)    Inc in anxiety/jittery  . Hydromorphone Hcl     REACTION: Itch, rash  . Varenicline Tartrate     REACTION: abnormal dreams    Antimicrobials this admission: Vancomycin 4/29, cefepime 4/30   >>    >>   Dose adjustments this admission: 5/1 0100 vanc level 12. Changed to 1500 mg q 8 hours. Level before 4th new dose.   Microbiology results: 4/29 BCx: pending  Thank you for allowing pharmacy to be a part of this patient's care.  Lenix Kidd S 01/19/2018 3:55 AM

## 2018-01-19 NOTE — Progress Notes (Signed)
Pt alert and oriented X4 resting in room. PRN pain medication given for left sided facial pain. IV abx infusing. Pt on room air. No other complaints from pt at this time.

## 2018-01-19 NOTE — Progress Notes (Signed)
[facility] at [place] NAME: Eric Wells    MR#:  [id number]  DATE OF BIRTH:  [birth year]  SUBJECTIVE:  CHIEF COMPLAINT:   Chief Complaint  Patient presents with  . cellulitis  slowly improving REVIEW OF SYSTEMS:  Review of Systems  Constitutional: Negative for chills, fever and malaise/fatigue.  HENT: Negative for sore throat.        Still facial swelling and pain on the left side.  Eyes: Negative for blurred vision and double vision.  Respiratory: Negative for cough, hemoptysis, shortness of breath, wheezing and stridor.   Cardiovascular: Negative for chest pain, palpitations, orthopnea and leg swelling.  Gastrointestinal: Negative for abdominal pain, blood in stool, diarrhea, melena, nausea and vomiting.  Genitourinary: Negative for dysuria, flank pain and hematuria.  Musculoskeletal: Negative for back pain and joint pain.  Skin: Negative for rash.  Neurological: Negative for dizziness, sensory change, focal weakness, seizures, loss of consciousness, weakness and headaches.  Endo/Heme/Allergies: Negative for polydipsia.  Psychiatric/Behavioral: Negative for depression. The patient is not nervous/anxious.    DRUG ALLERGIES:   Allergies  Allergen Reactions  . Cymbalta [Duloxetine Hcl] Other (See Comments)    Inc in anxiety/jittery  . Hydromorphone Hcl     REACTION: Itch, rash  . Varenicline Tartrate     REACTION: abnormal dreams   VITALS:  Blood pressure (!) 134/93, pulse 69, temperature 98 F (36.7 C), temperature source Oral, resp. rate 18, height 5\' 9"  (1.753 m), weight 89.9 kg (198 lb 3.2 oz), SpO2 98 %. PHYSICAL EXAMINATION:  Physical Exam  Constitutional: He is oriented to person, place, and time.  HENT:  Head: Normocephalic.  Mouth/Throat: Oropharynx is clear and moist.  Facial swelling and pain on the left side. Mouth swelling on left side.  Eyes: Pupils are equal, round, and reactive to light. Conjunctivae and EOM  are normal. No scleral icterus.  Neck: Normal range of motion. Neck supple. No JVD present. No tracheal deviation present.  Cardiovascular: Normal rate, regular rhythm and normal heart sounds. Exam reveals no gallop.  No murmur heard. Pulmonary/Chest: Effort normal and breath sounds normal. No respiratory distress. He has no wheezes. He has no rales.  Abdominal: Soft. Bowel sounds are normal. He exhibits no distension. There is no tenderness. There is no rebound.  Musculoskeletal: Normal range of motion. He exhibits no edema or tenderness.  Neurological: He is alert and oriented to person, place, and time. No cranial nerve deficit.  Skin: No rash noted. No erythema.   LABORATORY PANEL:  Male CBC Recent Labs  Lab 01/18/18 0306  WBC 8.9  HGB 14.0  HCT 40.5  PLT 287   ------------------------------------------------------------------------------------------------------------------ Chemistries  Recent Labs  Lab 01/17/18 1657 01/18/18 0306  NA 134* 138  K 3.3* 3.9  CL 98* 106  CO2 25 25  GLUCOSE 131* 112*  BUN 15 13  CREATININE 0.81 0.67  CALCIUM 9.2 8.6*  AST 25  --   ALT 43  --   ALKPHOS 87  --   BILITOT 0.5  --    RADIOLOGY:  No results found. ASSESSMENT AND PLAN:   * cellulitis of face- failed outpatient oral therapy   Continue IV vancomycin and cefepime for now.   We may switch to oral soon at [place] as his symptoms are getting better.  * hypertension   Continue home medication of amlodipine and lisinopril.  * hyperlipidemia   Continue atorvastatin.  * anxiety   Continue clonazepam.  *  hypokalemia repleted  * active smoking   Counseled to quit smoking for 4 minutes and offered nicotine patch by dr Bridgett Larsson.  All the records are reviewed and case discussed with Care Management/Social Worker. Management plans discussed with the patient, nursing and they are in agreement.  CODE STATUS: Full Code  TOTAL TIME TAKING CARE OF THIS PATIENT: 32  minutes.   More than 50% of the time was spent in counseling/coordination of care: YES  POSSIBLE D/C IN 1 DAYS, DEPENDING ON CLINICAL CONDITION.   Max Sane M.D on 01/19/2018 at 7:28 PM  Between 7am to 6pm - Pager - [phone]  After 6pm go to [email or web address] - [facility]

## 2018-01-19 NOTE — Progress Notes (Signed)
NT informed RN that patient request pain medication. Nurse went to assess patient's pain and to administer pain meds, however, patient was sleep (snoring and easily arousalable) and appeared comfortable and relaxed. Patient's cell phone alarm was sounding loudly as well. Meds returned to pyxis. Will continue to monitor and endorse.

## 2018-01-20 ENCOUNTER — Encounter: Payer: Self-pay | Admitting: Radiology

## 2018-01-20 ENCOUNTER — Inpatient Hospital Stay: Payer: BLUE CROSS/BLUE SHIELD

## 2018-01-20 LAB — BASIC METABOLIC PANEL
Anion gap: 7 (ref 5–15)
BUN: 9 mg/dL (ref 6–20)
CO2: 29 mmol/L (ref 22–32)
CREATININE: 0.69 mg/dL (ref 0.61–1.24)
Calcium: 8.8 mg/dL — ABNORMAL LOW (ref 8.9–10.3)
Chloride: 100 mmol/L — ABNORMAL LOW (ref 101–111)
GFR calc Af Amer: >60 mL/min (ref >60)
GLUCOSE: 112 mg/dL — AB (ref 65–99)
Potassium: 3.3 mmol/L — ABNORMAL LOW (ref 3.5–5.1)
SODIUM: 136 mmol/L (ref 135–145)

## 2018-01-20 LAB — CBC
HCT: 39 % — ABNORMAL LOW (ref 40.0–52.0)
Hemoglobin: 13.7 g/dL (ref 13.0–18.0)
MCH: 30.3 pg (ref 26.0–34.0)
MCHC: 35.2 g/dL (ref 32.0–36.0)
MCV: 86.1 fL (ref 80.0–100.0)
PLATELETS: 301 10*3/uL (ref 150–440)
RBC: 4.53 MIL/uL (ref 4.40–5.90)
RDW: 13.2 % (ref 11.5–14.5)
WBC: 9.7 10*3/uL (ref 3.8–10.6)

## 2018-01-20 LAB — VANCOMYCIN, TROUGH: VANCOMYCIN TR: 20 ug/mL (ref 15–20)

## 2018-01-20 MED ORDER — POTASSIUM CHLORIDE CRYS ER 20 MEQ PO TBCR
20.0000 meq | EXTENDED_RELEASE_TABLET | Freq: Once | ORAL | Status: AC
  Administered 2018-01-20: 20 meq via ORAL
  Filled 2018-01-20: qty 1

## 2018-01-20 MED ORDER — IOPAMIDOL (ISOVUE-370) INJECTION 76%
75.0000 mL | Freq: Once | INTRAVENOUS | Status: AC | PRN
  Administered 2018-01-20: 75 mL via INTRAVENOUS

## 2018-01-20 NOTE — Consult Note (Signed)
[facility] Infectious Disease     Reason for Consult: facial abscess   Referring Physician: Dolores Frame Date of Admission:  01/17/2018   Principal Problem:   Cellulitis   HPI: Eric Wells is a 47 y.o. male with acute onset facial abscess. Started as a  Scab which he was picking at. No dental pain. Seen in [facility] and given ceftriaxone and bactrim/minocycline but worsened. Admitted and CT showed facial cellulitis but no abscess. No fever, wbc nml. Started cefepime and vanco. Some worsening overnight so had repeat CT with some improvement.     Past Medical History:  Diagnosis Date  . Anxiety   . Depression 04/2008   Hosp., suicide risk  . Femur fracture ([place]) 1991   Left  . Hematuria 2011   Eval with Uro  . Kidney stones   . Nephrolithiasis    Dr. Terance Hart with Uro  . Tendon laceration 1993   Tricep   Past Surgical History:  Procedure Laterality Date  . CERVICAL SPINE SURGERY  2015   CERVICAL DISCETOMY POSTERIOR C5 TO 6 FUSION WITH INSTRUMENTATION  . CERVICAL SPINE SURGERY    . FEMUR SURGERY  1990  . KIDNEY STONE SURGERY  2000  . LITHOTRIPSY  2011  . TRICEPS TENDON REPAIR  1993   Social History   Tobacco Use  . Smoking status: Current Some Day Smoker    Packs/day: 1.00    Years: 20.00    Pack years: 20.00    Types: Cigarettes  . Smokeless tobacco: Never Used  Substance Use Topics  . Alcohol use: Yes    Alcohol/week: 0.0 oz    Comment: Rare  . Drug use: No   Family History  Problem Relation Age of Onset  . Heart disease Father        MI  . Heart disease Maternal Grandmother        CHF  . Cancer Maternal Grandfather        Colon CA  . Colon cancer Maternal Grandfather   . Cancer Paternal Grandmother        Breast CA  . Cancer Paternal Grandfather        Lung CA  . Prostate cancer Neg Hx     Allergies:  Allergies  Allergen Reactions  . Cymbalta [Duloxetine Hcl] Other (See Comments)    Inc in anxiety/jittery  . Hydromorphone Hcl    REACTION: Itch, rash  . Varenicline Tartrate     REACTION: abnormal dreams    Current antibiotics: Antibiotics Given (last 72 hours)    Date/Time Action Medication Dose Rate   01/17/18 1950 New Bag/Given   vancomycin (VANCOCIN) IVPB 1000 mg/200 mL premix 1,000 mg 200 mL/hr   01/17/18 2332 New Bag/Given   ceFEPIme (MAXIPIME) 2 g in sodium chloride 0.9 % 100 mL IVPB 2 g 200 mL/hr   01/18/18 0130 New Bag/Given   vancomycin (VANCOCIN) 1,250 mg in sodium chloride 0.9 % 250 mL IVPB 1,250 mg 166.7 mL/hr   01/18/18 1051 New Bag/Given   vancomycin (VANCOCIN) 1,250 mg in sodium chloride 0.9 % 250 mL IVPB 1,250 mg 166.7 mL/hr   01/18/18 1247 New Bag/Given   ceFEPIme (MAXIPIME) 2 g in sodium chloride 0.9 % 100 mL IVPB 2 g 200 mL/hr   01/18/18 1743 New Bag/Given   vancomycin (VANCOCIN) 1,250 mg in sodium chloride 0.9 % 250 mL IVPB 1,250 mg 166.7 mL/hr   01/19/18 0100 New Bag/Given   ceFEPIme (MAXIPIME) 2 g in sodium chloride 0.9 % 100  mL IVPB 2 g 200 mL/hr   01/19/18 0300 New Bag/Given   vancomycin (VANCOCIN) 1,250 mg in sodium chloride 0.9 % 250 mL IVPB 1,250 mg 166.7 mL/hr   01/19/18 1058 New Bag/Given   vancomycin (VANCOCIN) 1,500 mg in sodium chloride 0.9 % 500 mL IVPB 1,500 mg 250 mL/hr   01/19/18 1317 New Bag/Given   ceFEPIme (MAXIPIME) 2 g in sodium chloride 0.9 % 100 mL IVPB 2 g 200 mL/hr   01/19/18 1717 New Bag/Given   vancomycin (VANCOCIN) 1,500 mg in sodium chloride 0.9 % 500 mL IVPB 1,500 mg 250 mL/hr   01/20/18 0053 New Bag/Given   ceFEPIme (MAXIPIME) 2 g in sodium chloride 0.9 % 100 mL IVPB 2 g 200 mL/hr   01/20/18 0256 New Bag/Given   vancomycin (VANCOCIN) 1,500 mg in sodium chloride 0.9 % 500 mL IVPB 1,500 mg 250 mL/hr   01/20/18 1045 New Bag/Given   vancomycin (VANCOCIN) 1,500 mg in sodium chloride 0.9 % 500 mL IVPB 1,500 mg 250 mL/hr   01/20/18 1313 New Bag/Given   ceFEPIme (MAXIPIME) 2 g in sodium chloride 0.9 % 100 mL IVPB 2 g 200 mL/hr      MEDICATIONS: . amLODipine   10 mg Oral Daily  . atorvastatin  40 mg Oral Daily  . heparin  5,000 Units Subcutaneous Q8H  . lisinopril  15 mg Oral Daily  . nicotine  21 mg Transdermal Daily    Review of Systems - 11 systems reviewed and negative per HPI   OBJECTIVE: Temp:  [98 F (36.7 C)-98.3 F (36.8 C)] 98 F (36.7 C) (05/02 0754) Pulse Rate:  [69-73] 71 (05/02 0754) Resp:  [16-18] 18 (05/02 0754) BP: (134-143)/(91-93) 135/91 (05/02 0754) SpO2:  [96 %-100 %] 96 % (05/02 0754) Physical Exam  Constitutional: He is oriented to person, place, and time. He appears well-developed and well-nourished. No distress.  HENT: anicteric. L cheek around mouth with scab and small opening, no drainage, no fluctance, mild ttp. Mild erythema Mouth/Throat: Oropharynx is clear and moist. No dental abscess No oropharyngeal exudate.  Cardiovascular: Normal rate, regular rhythm and normal heart sounds. Exam reveals no gallop and no friction rub.  No murmur heard.  Pulmonary/Chest: Effort normal and breath sounds normal. No respiratory distress. He has no wheezes.  Abdominal: Soft. Bowel sounds are normal. He exhibits no distension. There is no tenderness.  Lymphadenopathy:  He has no cervical adenopathy.  Neurological: He is alert and oriented to person, place, and time.  Skin: Skin is warm and dry. No rash noted. No erythema.  Psychiatric: He has a normal mood and affect. His behavior is normal.     LABS: Results for orders placed or performed during the hospital encounter of 01/17/18 (from the past 48 hour(s))  Vancomycin, trough     Status: Abnormal   Collection Time: 01/19/18  2:00 AM  Result Value Ref Range   Vancomycin Tr 12 (L) 15 - 20 ug/mL    Comment: Performed at [facility], [address] [place],  [address]  CBC     Status: Abnormal   Collection Time: 01/20/18  4:05 AM  Result Value Ref Range   WBC 9.7 3.8 - 10.6 K/uL   RBC 4.53 4.40 - 5.90 MIL/uL   Hemoglobin 13.7 13.0 - 18.0 g/dL    HCT 39.0 (L) 40.0 - 52.0 %   MCV 86.1 80.0 - 100.0 fL   MCH 30.3 26.0 - 34.0 pg   MCHC 35.2 32.0 - 36.0 g/dL  RDW 13.2 11.5 - 14.5 %   Platelets 301 150 - 440 K/uL    Comment: Performed at [facility], [address] [place], [place] [address]  Basic metabolic panel     Status: Abnormal   Collection Time: 01/20/18  4:05 AM  Result Value Ref Range   Sodium 136 135 - 145 mmol/L   Potassium 3.3 (L) 3.5 - 5.1 mmol/L   Chloride 100 (L) 101 - 111 mmol/L   CO2 29 22 - 32 mmol/L   Glucose, Bld 112 (H) 65 - 99 mg/dL   BUN 9 6 - 20 mg/dL   Creatinine, Ser 0.69 0.61 - 1.24 mg/dL   Calcium 8.8 (L) 8.9 - 10.3 mg/dL   GFR calc non Af Amer >60 >60 mL/min   GFR calc Af Amer >60 >60 mL/min    Comment: (NOTE) The eGFR has been calculated using the CKD EPI equation. This calculation has not been validated in all clinical situations. eGFR's persistently <60 mL/min signify possible Chronic Kidney Disease.    Anion gap 7 5 - 15    Comment: Performed at [facility], [address] [place], [place] [address]  Vancomycin, trough     Status: None   Collection Time: 01/20/18  9:09 AM  Result Value Ref Range   Vancomycin Tr 20 15 - 20 ug/mL    Comment: Performed at [facility], [address] [place], [place] [address]   No components found for: ESR, C REACTIVE PROTEIN MICRO: Recent Results (from the past 720 hour(s))  Blood culture (routine x 2)     Status: None (Preliminary result)   Collection Time: 01/17/18  7:31 PM  Result Value Ref Range Status   Specimen Description BLOOD RT FOREARM  Final   Special Requests   Final    BOTTLES DRAWN AEROBIC AND ANAEROBIC Blood Culture adequate volume   Culture   Final    NO GROWTH 3 DAYS Performed at [facility], [address], [place], [place] [address]    Report Status PENDING  Incomplete  Blood culture (routine x 2)     Status: None (Preliminary result)   Collection Time: 01/17/18  7:31 PM  Result Value  Ref Range Status   Specimen Description BLOOD RT [facility]  Final   Special Requests   Final    BOTTLES DRAWN AEROBIC AND ANAEROBIC Blood Culture adequate volume   Culture   Final    NO GROWTH 3 DAYS Performed at [facility], [address] [place], [place] [address]    Report Status PENDING  Incomplete    IMAGING: Ct Maxillofacial W Contrast  Result Date: 01/20/2018 CLINICAL DATA:  Facial fracture. Mass, lump, or swelling, maxillofacial. Face cellulitis.  Evaluate for osteomyelitis. EXAM: CT MAXILLOFACIAL WITH CONTRAST TECHNIQUE: Multidetector CT imaging of the maxillofacial structures was performed with intravenous contrast. Multiplanar CT image reconstructions were also generated. CONTRAST:  65m ISOVUE-370 IOPAMIDOL (ISOVUE-370) INJECTION 76% COMPARISON:  01/17/2018 FINDINGS: Osseous: No evidence of osteomyelitis. No fracture. Teeth 11 and 12 have periapical erosions. Orbits: Negative.  No evidence of inflammation. Sinuses: Mild patchy mucosal thickening. No fluid levels or progression. The nasal septum is deviated towards the left. Soft tissues: Mild skin thickening and subcutaneous reticulation along the left more than right jaw. No abscess. Platysma thickening that is improved. Limited intracranial: Negative IMPRESSION: Facial cellulitis is improved compared to study 3 days prior. No abscess. Electronically Signed   By: JMonte FantasiaM.D.   On: 01/20/2018  10:54   Ct Maxillofacial W Contrast  Result Date: 01/17/2018 CLINICAL DATA:  47 y/o  M; worsening left facial swelling. EXAM: CT MAXILLOFACIAL WITH CONTRAST TECHNIQUE: Multidetector CT imaging of the maxillofacial structures was performed with intravenous contrast. Multiplanar CT image reconstructions were also generated. CONTRAST:  12m ISOVUE-370 IOPAMIDOL (ISOVUE-370) INJECTION 76% COMPARISON:  None. FINDINGS: Osseous: No fracture or mandibular dislocation. No destructive process. Orbits: Negative. No traumatic or inflammatory  finding. Sinuses: Opacification of the right middle ear cavity. Normal aeration of left mastoid air cells and middle ear cavity. Mild diffuse paranasal sinus mucosal thickening. Soft tissues: Inflammation of dermal and subdermal soft tissues overlying the body of the mandible and extending to the left cheek. No rim enhancing fluid collection. Limited intracranial: No significant or unexpected finding. IMPRESSION: Superficial left facial soft tissue inflammation overlying left body of mandible extending to left cheek compatible with cellulitis. No abscess. No extension of inflammation to deep facial or cervical compartments. Electronically Signed   By: LKristine GarbeM.D.   On: 01/17/2018 20:42    Assessment:   TChioke Noxonis a 47y.o. male with facial cellulitis, acute onset, no abscess on 2 CTs. BCx neg, no wbc, no fevers. HIV neg.   Recommendations I have cultured the scab on his face.  Cont vanco overnight. If improving then tomorrow would dc on clindamycin for a 7 day period tomorrow. He seemed to worsen on minocycline and bactrim prior to admit. Will need to fu on cx as otpt.  Thank you very much for allowing me to participate in the care of this patient. Please call with questions.   DCheral Marker FOla Spurr MD

## 2018-01-20 NOTE — Plan of Care (Signed)
Pt states pain is more intense than when he was first admitted. Alert and oriented. Independent in room. Ambulates to bathroom. IV antibiotics continued. Will continue to follow pt

## 2018-01-20 NOTE — Progress Notes (Signed)
[facility] [place] NAME: Eric Wells    MR#:  [id number]  DATE OF BIRTH:  1971-05-06  SUBJECTIVE:  CHIEF COMPLAINT:   Chief Complaint  Patient presents with  . cellulitis  feels face is worse, more swollen, feels he has abscess now REVIEW OF SYSTEMS:  Review of Systems  Constitutional: Negative for chills, fever and malaise/fatigue.  HENT: Negative for sore throat.        Still facial swelling and pain on the left side.  Eyes: Negative for blurred vision and double vision.  Respiratory: Negative for cough, hemoptysis, shortness of breath, wheezing and stridor.   Cardiovascular: Negative for chest pain, palpitations, orthopnea and leg swelling.  Gastrointestinal: Negative for abdominal pain, blood in stool, diarrhea, melena, nausea and vomiting.  Genitourinary: Negative for dysuria, flank pain and hematuria.  Musculoskeletal: Negative for back pain and joint pain.  Skin: Negative for rash.  Neurological: Negative for dizziness, sensory change, focal weakness, seizures, loss of consciousness, weakness and headaches.  Endo/Heme/Allergies: Negative for polydipsia.  Psychiatric/Behavioral: Negative for depression. The patient is not nervous/anxious.    DRUG ALLERGIES:   Allergies  Allergen Reactions  . Cymbalta [Duloxetine Hcl] Other (See Comments)    Inc in anxiety/jittery  . Hydromorphone Hcl     REACTION: Itch, rash  . Varenicline Tartrate     REACTION: abnormal dreams   VITALS:  Blood pressure (!) 135/91, pulse 71, temperature 98 F (36.7 C), temperature source Oral, resp. rate 18, height 5\' 9"  (1.753 m), weight 89.9 kg (198 lb 3.2 oz), SpO2 96 %. PHYSICAL EXAMINATION:  Physical Exam  Constitutional: He is oriented to person, place, and time.  HENT:  Head: Normocephalic.  Mouth/Throat: Oropharynx is clear and moist.  Facial swelling and pain on the left side. Mouth swelling on left side.  Eyes: Pupils are equal, round,  and reactive to light. Conjunctivae and EOM are normal. No scleral icterus.  Neck: Normal range of motion. Neck supple. No JVD present. No tracheal deviation present.  Cardiovascular: Normal rate, regular rhythm and normal heart sounds. Exam reveals no gallop.  No murmur heard. Pulmonary/Chest: Effort normal and breath sounds normal. No respiratory distress. He has no wheezes. He has no rales.  Abdominal: Soft. Bowel sounds are normal. He exhibits no distension. There is no tenderness. There is no rebound.  Musculoskeletal: Normal range of motion. He exhibits no edema or tenderness.  Neurological: He is alert and oriented to person, place, and time. No cranial nerve deficit.  Skin: No rash noted. No erythema.   LABORATORY PANEL:  Male CBC Recent Labs  Lab 01/20/18 0405  WBC 9.7  HGB 13.7  HCT 39.0*  PLT 301   ------------------------------------------------------------------------------------------------------------------ Chemistries  Recent Labs  Lab 01/17/18 1657  01/20/18 0405  NA 134*   < > 136  K 3.3*   < > 3.3*  CL 98*   < > 100*  CO2 25   < > 29  GLUCOSE 131*   < > 112*  BUN 15   < > 9  CREATININE 0.81   < > 0.69  CALCIUM 9.2   < > 8.8*  AST 25  --   --   ALT 43  --   --   ALKPHOS 87  --   --   BILITOT 0.5  --   --    < > = values in this interval not displayed.   RADIOLOGY:  No results found. ASSESSMENT AND  PLAN:   * Cellulitis of face- failed outpatient oral therapy   Continue IV vancomycin and cefepime for now.   We may switch to oral soon at as his symptoms are getting better.  * Hypertension   Continue home medication of amlodipine and lisinopril.  * Hyperlipidemia   Continue atorvastatin.  * Anxiety   Continue clonazepam.  * Hypokalemia repleted  * Active smoking   Counseled to quit smoking for 4 minutes and offered nicotine patch by dr Bridgett Larsson.    All the records are reviewed and case discussed with Care Management/Social  Worker Management plans discussed with the patient, nursing and they are in agreement.  CODE STATUS: Full Code  TOTAL TIME TAKING CARE OF THIS PATIENT: 32 minutes.   More than 50% of the time was spent in counseling/coordination of care: YES  POSSIBLE D/C IN 1-2 DAYS, DEPENDING ON CLINICAL CONDITION.   Max Sane M.D on 01/20/2018 at 8:58 AM  Between 7am to 6pm - Pager - [phone]  After 6pm go to [email or web address] - [facility]

## 2018-01-21 ENCOUNTER — Other Ambulatory Visit: Payer: Self-pay | Admitting: Family Medicine

## 2018-01-21 LAB — BASIC METABOLIC PANEL
Anion gap: 7 (ref 5–15)
BUN: 8 mg/dL (ref 6–20)
CALCIUM: 8.9 mg/dL (ref 8.9–10.3)
CHLORIDE: 101 mmol/L (ref 101–111)
CO2: 30 mmol/L (ref 22–32)
CREATININE: 0.51 mg/dL — AB (ref 0.61–1.24)
GFR calc non Af Amer: >60 mL/min (ref >60)
Glucose, Bld: 99 mg/dL (ref 65–99)
Potassium: 3.7 mmol/L (ref 3.5–5.1)
SODIUM: 138 mmol/L (ref 135–145)

## 2018-01-21 LAB — CBC
HCT: 39.8 % — ABNORMAL LOW (ref 40.0–52.0)
Hemoglobin: 13.9 g/dL (ref 13.0–18.0)
MCH: 29.7 pg (ref 26.0–34.0)
MCHC: 34.9 g/dL (ref 32.0–36.0)
MCV: 85.1 fL (ref 80.0–100.0)
Platelets: 321 10*3/uL (ref 150–440)
RBC: 4.68 MIL/uL (ref 4.40–5.90)
RDW: 12.8 % (ref 11.5–14.5)
WBC: 9.6 10*3/uL (ref 3.8–10.6)

## 2018-01-21 LAB — VANCOMYCIN, TROUGH: VANCOMYCIN TR: 18 ug/mL (ref 15–20)

## 2018-01-21 MED ORDER — NICOTINE 21 MG/24HR TD PT24: 21.0000 mg | MEDICATED_PATCH | Freq: Every day | TRANSDERMAL | 0 refills | Status: DC

## 2018-01-21 MED ORDER — ACETAMINOPHEN 325 MG PO TABS: 650.0000 mg | ORAL_TABLET | Freq: Four times a day (QID) | ORAL | Status: DC | PRN

## 2018-01-21 MED ORDER — CALCIUM CARBONATE ANTACID 500 MG PO CHEW: 1.0000 | CHEWABLE_TABLET | ORAL | Status: DC | PRN

## 2018-01-21 MED ORDER — CLINDAMYCIN HCL 300 MG PO CAPS: 300.0000 mg | ORAL_CAPSULE | Freq: Three times a day (TID) | ORAL | 0 refills | Status: AC

## 2018-01-21 MED ORDER — DOCUSATE SODIUM 100 MG PO CAPS: 100.0000 mg | ORAL_CAPSULE | Freq: Two times a day (BID) | ORAL | 0 refills | Status: DC | PRN

## 2018-01-21 MED ORDER — SACCHAROMYCES BOULARDII 250 MG PO CAPS: 250.0000 mg | ORAL_CAPSULE | Freq: Two times a day (BID) | ORAL | 0 refills | Status: DC

## 2018-01-21 MED ORDER — OXYCODONE HCL 5 MG PO TABS: 5.0000 mg | ORAL_TABLET | Freq: Four times a day (QID) | ORAL | 0 refills | Status: DC | PRN

## 2018-01-21 NOTE — Progress Notes (Signed)
Awake and medicated for pain per mar. Call bell in reach. Independent to br.

## 2018-01-21 NOTE — Progress Notes (Signed)
Requesting pain med

## 2018-01-21 NOTE — Progress Notes (Signed)
Patient anxious to "get out here." Discharge instructions and prescriptions reviewed in detail with patient with verbalization of understanding. Patient refused a wheelchair and walked out with his father.

## 2018-01-21 NOTE — Discharge Summary (Signed)
[facility] [place] NAME: Eric Wells    MR#:  [id number]  DATE OF BIRTH:  May 13, 1971  DATE OF ADMISSION:  01/17/2018 ADMITTING PHYSICIAN: Vaughan Basta, MD  DATE OF DISCHARGE: 01/21/18 PRIMARY CARE PHYSICIAN: Tonia Ghent, MD    ADMISSION DIAGNOSIS:  Facial cellulitis [L03.211]  DISCHARGE DIAGNOSIS:  Facial cellulitis  SECONDARY DIAGNOSIS:   Past Medical History:  Diagnosis Date  . Anxiety   . Depression 04/2008   Hosp., suicide risk  . Femur fracture ([place]) 1991   Left  . Hematuria 2011   Eval with Uro  . Kidney stones   . Nephrolithiasis    Dr. Terance Hart with Uro  . Tendon laceration [place] COURSE:  HISTORY OF PRESENT ILLNESS: Eric Wells  is a 47 y.o. male with a known history of anxiety, depression, kidney stones- started having swelling on his left side jaw and upper neck for last 4 days, went to urgent care Center where they gave him 1 injection ceftriaxone and gave him oral Bactrim and sent home. Since then he is taking oral Bactrim for last 3 days. But his symptoms are getting worse his swelling is extending to his left cheek, and lower face, so he came to emergency room for further management. CT scan as well facial did not show any localized abscess but because of failure of outpatient oral antibiotic therapy ER physician suggested to admit and give IV antibiotic therapy.  * Cellulitis of face- failed outpatient oral therapy Patient was given IV cefepime and vancomycin during the hospital course Wound culture and sensitivity has revealed no organisms so far, primary care physician to follow-up.  Patient has noticed a small 3 x 2 cm  firm lump last night after CT scan was performed , he prefers warm compresses and p.o. antibiotics p.o. clindamycin for 7 days as recommended by infectious disease, with Florastor and refusing surgery consult and wants to go home at this time  Outpatient  follow-up with primary care physician in 5 to 7 days  * Hypertension Continue home medication of amlodipine and lisinopril.  * Hyperlipidemia Continue atorvastatin.  * Anxiety Continue clonazepam.  * Hypokalemia repleted  * Active smoking Counseled to quit smoking for 4 minutes and offered nicotine patch by dr Bridgett Larsson.     DISCHARGE CONDITIONS:   Stable  CONSULTS OBTAINED:  Treatment Team:  Leonel Ramsay, MD   PROCEDURES none  DRUG ALLERGIES:   Allergies  Allergen Reactions  . Cymbalta [Duloxetine Hcl] Other (See Comments)    Inc in anxiety/jittery  . Hydromorphone Hcl     REACTION: Itch, rash  . Varenicline Tartrate     REACTION: abnormal dreams    DISCHARGE MEDICATIONS:   Allergies as of 01/21/2018      Reactions   Cymbalta [duloxetine Hcl] Other (See Comments)   Inc in anxiety/jittery   Hydromorphone Hcl    REACTION: Itch, rash   Varenicline Tartrate    REACTION: abnormal dreams      Medication List    STOP taking these medications   minocycline 50 MG capsule Commonly known as:  MINOCIN,DYNACIN   sulfamethoxazole-trimethoprim 800-160 MG tablet Commonly known as:  BACTRIM DS,SEPTRA DS     TAKE these medications   acetaminophen 325 MG tablet Commonly known as:  TYLENOL Take 2 tablets (650 mg total) by mouth every 6 (six) hours as needed for mild pain, moderate pain or fever (headache).  amLODipine 10 MG tablet Commonly known as:  NORVASC Take 1 tablet (10 mg total) by mouth daily.   atorvastatin 20 MG tablet Commonly known as:  LIPITOR Take 2 tablets (40 mg total) by mouth daily.   calcium carbonate 500 MG chewable tablet Commonly known as:  TUMS - dosed in mg elemental calcium Chew 1 tablet (200 mg of elemental calcium total) by mouth as needed for indigestion or heartburn.   clindamycin 300 MG capsule Commonly known as:  CLEOCIN Take 1 capsule (300 mg total) by mouth 3 (three) times daily for 7 days.   clonazePAM  1 MG tablet Commonly known as:  KLONOPIN TAKE ONE-HALF TO 1 TABLET DAILY AS NEEDED FOR ANXIETY   docusate sodium 100 MG capsule Commonly known as:  COLACE Take 1 capsule (100 mg total) by mouth 2 (two) times daily as needed for mild constipation.   lisinopril 10 MG tablet Commonly known as:  PRINIVIL,ZESTRIL Take 1.5 tablets (15 mg total) by mouth daily.   nicotine 21 mg/24hr patch Commonly known as:  NICODERM CQ - dosed in mg/24 hours Place 1 patch (21 mg total) onto the skin daily.   oxyCODONE 5 MG immediate release tablet Commonly known as:  Oxy IR/ROXICODONE Take 1-2 tablets (5-10 mg total) by mouth every 6 (six) hours as needed for moderate pain or severe pain. What changed:    how much to take  when to take this  reasons to take this  additional instructions   saccharomyces boulardii 250 MG capsule Commonly known as:  FLORASTOR Take 1 capsule (250 mg total) by mouth 2 (two) times daily.   sildenafil 100 MG tablet Commonly known as:  VIAGRA TAKE 1/2 TO 1 TABLET BY MOUTH DAILY AS NEEDED FOR ERECTILE DYSFUNCTION        DISCHARGE INSTRUCTIONS:   Follow-up with primary care physician in 5 to 7 days  DIET:  Cardiac diet  DISCHARGE CONDITION:  Stable  ACTIVITY:  Activity as tolerated  OXYGEN:  Home Oxygen: No.   Oxygen Delivery: room air  DISCHARGE LOCATION:  home   If you experience worsening of your admission symptoms, develop shortness of breath, life threatening emergency, suicidal or homicidal thoughts you must seek medical attention immediately by calling 911 or calling your MD immediately  if symptoms less severe.  You Must read complete instructions/literature along with all the possible adverse reactions/side effects for all the Medicines you take and that have been prescribed to you. Take any new Medicines after you have completely understood and accpet all the possible adverse reactions/side effects.   Please note  You were cared for by a  hospitalist during your hospital stay. If you have any questions about your discharge medications or the care you received while you were in the hospital after you are discharged, you can call the unit and asked to speak with the hospitalist on call if the hospitalist that took care of you is not available. Once you are discharged, your primary care physician will handle any further medical issues. Please note that NO REFILLS for any discharge medications will be authorized once you are discharged, as it is imperative that you return to your primary care physician (or establish a relationship with a primary care physician if you do not have one) for your aftercare needs so that they can reassess your need for medications and monitor your lab values.     Today  Chief Complaint  Patient presents with  . cellulitis   Patient has  noticed a small 3 x 2 cm  firm lump last night after CT scan was performed , he prefers warm compresses and p.o. antibiotics p.o. clindamycin for 7 days as recommended by infectious disease and refusing surgery consult and wants to go home at this time   ROS:  CONSTITUTIONAL: Denies fevers, chills. Denies any fatigue, weakness.  EYES: Denies blurry vision, double vision, eye pain. EARS, NOSE, THROAT: Denies tinnitus, ear pain, hearing loss. RESPIRATORY: Denies cough, wheeze, shortness of breath.  CARDIOVASCULAR: Denies chest pain, palpitations, edema.  GASTROINTESTINAL: Denies nausea, vomiting, diarrhea, abdominal pain. Denies bright red blood per rectum. GENITOURINARY: Denies dysuria, hematuria. ENDOCRINE: Denies nocturia or thyroid problems. HEMATOLOGIC AND LYMPHATIC: Denies easy bruising or bleeding. SKIN: Left angle of mouth with small lump, denies rash . MUSCULOSKELETAL: Denies pain in neck, back, shoulder, knees, hips or arthritic symptoms.  NEUROLOGIC: Denies paralysis, paresthesias.  PSYCHIATRIC: Denies anxiety or depressive symptoms.   VITAL SIGNS:  Blood  pressure (!) 138/97, pulse 72, temperature 98.1 F (36.7 C), temperature source Oral, resp. rate 20, height 5\' 9"  (1.753 m), weight 89.9 kg (198 lb 3.2 oz), SpO2 96 %.  I/O:    Intake/Output Summary (Last 24 hours) at 01/21/2018 0932 Last data filed at 01/21/2018 0300 Gross per 24 hour  Intake 2220 ml  Output 350 ml  Net 1870 ml    PHYSICAL EXAMINATION:  GENERAL:  47 y.o.-year-old patient lying in the bed with no acute distress.  EYES: Pupils equal, round, reactive to light and accommodation. No scleral icterus. Extraocular muscles intact.  HEENT: Head atraumatic, normocephalic. Oropharynx and nasopharynx clear.  NECK:  Supple, no jugular venous distention. No thyroid enlargement, no tenderness.  LUNGS: Normal breath sounds bilaterally, no wheezing, rales,rhonchi or crepitation. No use of accessory muscles of respiration.  CARDIOVASCULAR: S1, S2 normal. No murmurs, rubs, or gallops.  ABDOMEN: Soft, non-tender, non-distended. Bowel sounds present. No organomegaly or mass.  EXTREMITIES: No pedal edema, cyanosis, or clubbing.  NEUROLOGIC: Cranial nerves II through XII are intact. Muscle strength 5/5 in all extremities. Sensation intact. Gait not checked.  PSYCHIATRIC: The patient is alert and oriented x 3.  SKIN: Left-sided angle of mouth small firm lump with minimal tenderness is noted which is 2 x 3 cm in size no fluctuance , no discharge, no obvious rash  DATA REVIEW:   CBC Recent Labs  Lab 01/21/18 0414  WBC 9.6  HGB 13.9  HCT 39.8*  PLT 321    Chemistries  Recent Labs  Lab 01/17/18 1657  01/21/18 0414  NA 134*   < > 138  K 3.3*   < > 3.7  CL 98*   < > 101  CO2 25   < > 30  GLUCOSE 131*   < > 99  BUN 15   < > 8  CREATININE 0.81   < > 0.51*  CALCIUM 9.2   < > 8.9  AST 25  --   --   ALT 43  --   --   ALKPHOS 87  --   --   BILITOT 0.5  --   --    < > = values in this interval not displayed.    Cardiac Enzymes No results for input(s): TROPONINI in the last 168  hours.  Microbiology Results  Results for orders placed or performed during the hospital encounter of 01/17/18  Blood culture (routine x 2)     Status: None (Preliminary result)   Collection Time: 01/17/18  7:31 PM  Result Value Ref Range Status   Specimen Description BLOOD RT FOREARM  Final   Special Requests   Final    BOTTLES DRAWN AEROBIC AND ANAEROBIC Blood Culture adequate volume   Culture   Final    NO GROWTH 4 DAYS Performed at [facility], [address], [place], [place] [address]    Report Status PENDING  Incomplete  Blood culture (routine x 2)     Status: None (Preliminary result)   Collection Time: 01/17/18  7:31 PM  Result Value Ref Range Status   Specimen Description BLOOD RT [facility]  Final   Special Requests   Final    BOTTLES DRAWN AEROBIC AND ANAEROBIC Blood Culture adequate volume   Culture   Final    NO GROWTH 4 DAYS Performed at [facility], [address] [place], [place] [address]    Report Status PENDING  Incomplete  Aerobic Culture (superficial specimen)     Status: None (Preliminary result)   Collection Time: 01/20/18  1:55 PM  Result Value Ref Range Status   Specimen Description   Final    FACE Performed at [facility], [address], [place], [place] [address]    Special Requests   Final    Normal Performed at [facility], [address] [place], [place] [address]    Gram Stain   Final    RARE WBC PRESENT, PREDOMINANTLY MONONUCLEAR NO ORGANISMS SEEN Performed at [facility], [place] [address], [place],  [address]    Culture PENDING  Incomplete   Report Status PENDING  Incomplete    RADIOLOGY:  Ct Maxillofacial W Contrast  Result Date: 01/20/2018 CLINICAL DATA:  Facial fracture. Mass, lump, or swelling, maxillofacial. Face cellulitis.  Evaluate for osteomyelitis. EXAM: CT MAXILLOFACIAL WITH CONTRAST TECHNIQUE: Multidetector CT imaging of the maxillofacial structures was performed with  intravenous contrast. Multiplanar CT image reconstructions were also generated. CONTRAST:  4mL ISOVUE-370 IOPAMIDOL (ISOVUE-370) INJECTION 76% COMPARISON:  01/17/2018 FINDINGS: Osseous: No evidence of osteomyelitis. No fracture. Teeth 11 and 12 have periapical erosions. Orbits: Negative.  No evidence of inflammation. Sinuses: Mild patchy mucosal thickening. No fluid levels or progression. The nasal septum is deviated towards the left. Soft tissues: Mild skin thickening and subcutaneous reticulation along the left more than right jaw. No abscess. Platysma thickening that is improved. Limited intracranial: Negative IMPRESSION: Facial cellulitis is improved compared to study 3 days prior. No abscess. Electronically Signed   By: Monte Fantasia M.D.   On: 01/20/2018 10:54   Ct Maxillofacial W Contrast  Result Date: 01/17/2018 CLINICAL DATA:  47 y/o  M; worsening left facial swelling. EXAM: CT MAXILLOFACIAL WITH CONTRAST TECHNIQUE: Multidetector CT imaging of the maxillofacial structures was performed with intravenous contrast. Multiplanar CT image reconstructions were also generated. CONTRAST:  43mL ISOVUE-370 IOPAMIDOL (ISOVUE-370) INJECTION 76% COMPARISON:  None. FINDINGS: Osseous: No fracture or mandibular dislocation. No destructive process. Orbits: Negative. No traumatic or inflammatory finding. Sinuses: Opacification of the right middle ear cavity. Normal aeration of left mastoid air cells and middle ear cavity. Mild diffuse paranasal sinus mucosal thickening. Soft tissues: Inflammation of dermal and subdermal soft tissues overlying the body of the mandible and extending to the left cheek. No rim enhancing fluid collection. Limited intracranial: No significant or unexpected finding. IMPRESSION: Superficial left facial soft tissue inflammation overlying left body of mandible extending to left cheek compatible with cellulitis. No abscess. No extension of inflammation to deep facial or cervical compartments.  Electronically Signed  By: Kristine Garbe M.D.   On: 01/17/2018 20:42    EKG:  No orders found for this or any previous visit.    Management plans discussed with the patient, family and they are in agreement.  CODE STATUS:     Code Status Orders  (From admission, onward)        Start     Ordered   01/17/18 2228  Full code  Continuous     01/17/18 2228    Code Status History    This patient has a current code status but no historical code status.      TOTAL TIME TAKING CARE OF THIS PATIENT: 41 minutes.   Note: This dictation was prepared with Dragon dictation along with smaller phrase technology. Any transcriptional errors that result from this process are unintentional.   @[facility] @  on 01/21/2018 at 9:32 AM  Between 7am to 6pm - Pager - [phone]  After 6pm go to [email or web address] - password [email or web address] [facility]  Office  [phone]  CC: Primary care physician; Tonia Ghent, MD

## 2018-01-21 NOTE — Progress Notes (Signed)
No acute events overnight. Slept in short intervals. Pain meds given per mar. Independent to br. Uses call bell approply.

## 2018-01-21 NOTE — Discharge Instructions (Signed)
Follow-up with primary care physician in 5 to 7 days Apply warm compresses to the facial lump Complete your antibiotics even if you start to feel better

## 2018-01-21 NOTE — Telephone Encounter (Signed)
Electronic refill request. Clonazepam Last office visit:   01/17/18 Last Filled:    30 tablet 1 11/22/2017  Please advise.

## 2018-01-22 LAB — CULTURE, BLOOD (ROUTINE X 2)
CULTURE: NO GROWTH
Culture: NO GROWTH
Special Requests: ADEQUATE
Special Requests: ADEQUATE

## 2018-01-23 LAB — AEROBIC CULTURE W GRAM STAIN (SUPERFICIAL SPECIMEN): Special Requests: NORMAL

## 2018-01-23 LAB — AEROBIC CULTURE  (SUPERFICIAL SPECIMEN)

## 2018-01-23 NOTE — Telephone Encounter (Signed)
Sent. Thanks.   

## 2018-01-27 ENCOUNTER — Inpatient Hospital Stay: Payer: BLUE CROSS/BLUE SHIELD | Admitting: Family Medicine

## 2018-01-28 DIAGNOSIS — L03211 Cellulitis of face: Secondary | ICD-10-CM | POA: Diagnosis not present

## 2018-01-29 ENCOUNTER — Encounter: Payer: Self-pay | Admitting: Infectious Diseases

## 2018-01-29 DIAGNOSIS — L03211 Cellulitis of face: Secondary | ICD-10-CM | POA: Insufficient documentation

## 2018-03-02 DIAGNOSIS — M5416 Radiculopathy, lumbar region: Secondary | ICD-10-CM | POA: Diagnosis not present

## 2018-03-27 ENCOUNTER — Other Ambulatory Visit: Payer: Self-pay | Admitting: Family Medicine

## 2018-03-28 ENCOUNTER — Other Ambulatory Visit: Payer: Self-pay | Admitting: *Deleted

## 2018-03-28 NOTE — Telephone Encounter (Signed)
Last refill 01/23/18 #30/1 Last office visit 07/28/17 Patient cancelled appointment 01/27/18

## 2018-03-28 NOTE — Telephone Encounter (Addendum)
Name of Medication: clonazePAM (KLONOPIN) 1 MG tablet  Name of Pharmacy: [facility] or Written Date and Quantity: 01/23/2018 #30 with 1 refill  Last Office Visit and Type: 01/17/2018 Acute   Next Office Visit and Type:   Last Controlled Substance Agreement Date:   Last UDS:

## 2018-03-29 NOTE — Telephone Encounter (Signed)
Sent on other request.

## 2018-03-29 NOTE — Telephone Encounter (Signed)
Sent. Thanks.   

## 2018-04-27 DIAGNOSIS — M5412 Radiculopathy, cervical region: Secondary | ICD-10-CM | POA: Diagnosis not present

## 2018-04-27 DIAGNOSIS — M5416 Radiculopathy, lumbar region: Secondary | ICD-10-CM | POA: Diagnosis not present

## 2018-05-17 ENCOUNTER — Encounter: Payer: Self-pay | Admitting: *Deleted

## 2018-05-17 ENCOUNTER — Other Ambulatory Visit: Payer: Self-pay | Admitting: Family Medicine

## 2018-05-27 ENCOUNTER — Other Ambulatory Visit: Payer: Self-pay | Admitting: Family Medicine

## 2018-05-27 NOTE — Telephone Encounter (Signed)
Electronic refill request. Clonazepam Last office visit:   01/17/18 Last Filled:     30 tablet 1 03/29/2018  Please advise.

## 2018-05-29 NOTE — Telephone Encounter (Signed)
Sent. Thanks.   

## 2018-06-22 DIAGNOSIS — M25511 Pain in right shoulder: Secondary | ICD-10-CM | POA: Diagnosis not present

## 2018-07-12 DIAGNOSIS — Z716 Tobacco abuse counseling: Secondary | ICD-10-CM | POA: Diagnosis not present

## 2018-07-12 DIAGNOSIS — R509 Fever, unspecified: Secondary | ICD-10-CM | POA: Diagnosis not present

## 2018-07-12 DIAGNOSIS — J0100 Acute maxillary sinusitis, unspecified: Secondary | ICD-10-CM | POA: Diagnosis not present

## 2018-07-12 DIAGNOSIS — G4762 Sleep related leg cramps: Secondary | ICD-10-CM | POA: Diagnosis not present

## 2018-07-25 ENCOUNTER — Other Ambulatory Visit: Payer: Self-pay

## 2018-07-25 ENCOUNTER — Ambulatory Visit
Admission: EM | Admit: 2018-07-25 | Discharge: 2018-07-25 | Disposition: A | Discharge status: Home / Self Care | Payer: BLUE CROSS/BLUE SHIELD | Attending: Family Medicine | Admitting: Family Medicine

## 2018-07-25 DIAGNOSIS — H6591 Unspecified nonsuppurative otitis media, right ear: Secondary | ICD-10-CM | POA: Diagnosis not present

## 2018-07-25 MED ORDER — CEFDINIR 300 MG PO CAPS: 300.0000 mg | ORAL_CAPSULE | Freq: Two times a day (BID) | ORAL | 0 refills | Status: DC

## 2018-07-25 NOTE — Discharge Instructions (Signed)
If persists, see ENT [facility]).  Take care  Dr. Lacinda Axon

## 2018-07-25 NOTE — ED Provider Notes (Signed)
[facility]    CSN: [id number] Arrival date & time: 07/25/18  1552  History   Chief Complaint Chief Complaint  Patient presents with  . Otalgia   HPI 47 year old male presents with right ear pain.  Patient reports that few weeks ago he was seen and diagnosed with an ear infection.  Was placed on Augmentin.  Patient states that he improved but then worsened again this past Saturday.  He reports difficulty hearing, tinnitus, and right ear pain.  No drainage from the ear.  Patient states that he is having severe pain.  Associated sore throat.  No fever.  No chills.  No other associated symptoms.  No other complaints.  PMH, Surgical Hx, Family Hx, Social History reviewed and updated as below.  Past Medical History:  Diagnosis Date  . Anxiety   . Depression 04/2008   Hosp., suicide risk  . Femur fracture ([place]) 1991   Left  . Hematuria 2011   Eval with Uro  . Kidney stones   . Nephrolithiasis    Dr. Terance Hart with Uro  . Tendon laceration 1993   Tricep    Patient Active Problem List   Diagnosis Date Noted  . Facial cellulitis 01/29/2018  . Facial infection 01/17/2018  . Cellulitis 01/17/2018  . Skin lesion 07/29/2017  . Encounter for general adult medical examination with abnormal findings 04/08/2017  . Advance care planning 04/08/2017  . Neck pain 04/08/2017  . HTN (hypertension) 02/08/2014  . Lipoma 07/24/2013  . Pain in joint, ankle and foot 05/12/2013  . Erectile dysfunction 05/12/2013  . Smoking 11/30/2011  . Groin mass 07/05/2011  . Headache(784.0) 04/29/2011  . CHRONIC OBSTRUCTIVE PULMONARY DISEASE, ACUTE EXACERBATION 02/11/2009  . PREDIABETES 08/06/2008  . PURE HYPERCHOLESTEROLEMIA 08/03/2008  . Anxiety state 07/27/2008  . Calculus of kidney 07/27/2008    Past Surgical History:  Procedure Laterality Date  . CERVICAL SPINE SURGERY  2015   CERVICAL DISCETOMY POSTERIOR C5 TO 6 FUSION WITH INSTRUMENTATION  . CERVICAL SPINE SURGERY    . FEMUR  SURGERY  1990  . KIDNEY STONE SURGERY  2000  . LITHOTRIPSY  2011  . [place] Medications    Prior to Admission medications   Medication Sig Start Date End Date Taking? Authorizing Provider  amLODipine (NORVASC) 10 MG tablet TAKE 1 TABLET(10 MG) BY MOUTH DAILY 05/17/18  Yes Tonia Ghent, MD  atorvastatin (LIPITOR) 20 MG tablet Take 2 tablets (40 mg total) by mouth daily. 06/14/17  Yes Tonia Ghent, MD  lisinopril (PRINIVIL,ZESTRIL) 10 MG tablet Take 1.5 tablets (15 mg total) by mouth daily. 04/07/17  Yes Tonia Ghent, MD  oxyCODONE (OXY IR/ROXICODONE) 5 MG immediate release tablet Take 1-2 tablets (5-10 mg total) by mouth every 6 (six) hours as needed for moderate pain or severe pain. 01/21/18  Yes Gouru, Illene Silver, MD  sildenafil (VIAGRA) 100 MG tablet TAKE 1/2 TO 1 TABLET BY MOUTH DAILY AS NEEDED FOR ERECTILE DYSFUNCTION 08/25/17  Yes Tonia Ghent, MD  cefdinir (OMNICEF) 300 MG capsule Take 1 capsule (300 mg total) by mouth 2 (two) times daily. 07/25/18   Coral Spikes, DO    Family History Family History  Problem Relation Age of Onset  . Heart disease Father        MI  . Heart disease Maternal Grandmother        CHF  . Cancer Maternal Grandfather  Colon CA  . Colon cancer Maternal Grandfather   . Cancer Paternal Grandmother        Breast CA  . Cancer Paternal Grandfather        Lung CA  . Prostate cancer Neg Hx     Social History Social History   Tobacco Use  . Smoking status: Current Some Day Smoker    Packs/day: 1.00    Years: 20.00    Pack years: 20.00    Types: Cigarettes  . Smokeless tobacco: Never Used  Substance Use Topics  . Alcohol use: Yes    Alcohol/week: 0.0 standard drinks    Comment: Rare  . Drug use: No     Allergies   Cymbalta [duloxetine hcl]; Hydromorphone hcl; and Varenicline tartrate   Review of Systems Review of Systems  Constitutional: Negative for fever.  HENT: Positive for ear pain and  sore throat.    Physical Exam Triage Vital Signs ED Triage Vitals  Enc Vitals Group     BP 07/25/18 1605 130/83     Pulse Rate 07/25/18 1605 83     Resp 07/25/18 1605 18     Temp 07/25/18 1605 98.5 F (36.9 C)     Temp Source 07/25/18 1605 Oral     SpO2 07/25/18 1605 98 %     Weight 07/25/18 1604 190 lb (86.2 kg)     Height 07/25/18 1604 5\' 8"  (1.727 m)     Head Circumference --      Peak Flow --      Pain Score 07/25/18 1603 5     Pain Loc --      Pain Edu? --      Excl. in [place]? --    Updated Vital Signs BP 130/83 (BP Location: Left Arm)   Pulse 83   Temp 98.5 F (36.9 C) (Oral)   Resp 18   Ht 5\' 8"  (1.727 m)   Wt 86.2 kg   SpO2 98%   BMI 28.89 kg/m   Visual Acuity Right Eye Distance:   Left Eye Distance:   Bilateral Distance:    Right Eye Near:   Left Eye Near:    Bilateral Near:     Physical Exam  Constitutional: He is oriented to person, place, and time. He appears well-developed. No distress.  HENT:  Left TM normal. Right TM with erythema effusion, dullness. Oropharynx with moderate erythema.  Cardiovascular: Normal rate and regular rhythm.  Pulmonary/Chest: Effort normal. No respiratory distress.  Neurological: He is alert and oriented to person, place, and time.  Psychiatric: He has a normal mood and affect. His behavior is normal.  Nursing note and vitals reviewed.  UC Treatments / Results  Labs (all labs ordered are listed, but only abnormal results are displayed) Labs Reviewed - No data to display  EKG None  Radiology No results found.  Procedures Procedures (including critical care time)  Medications Ordered in UC Medications - No data to display  Initial Impression / Assessment and Plan / UC Course  I have reviewed the triage vital signs and the nursing notes.  Pertinent labs & imaging results that were available during my care of the patient were reviewed by me and considered in my medical decision making (see chart for  details).    47 year old male presents with OME.  Treating with Omnicef.  Advised to see ENT if he fails to improve or worsens.  Final Clinical Impressions(s) / UC Diagnoses   Final diagnoses:  Right otitis media  with effusion     Discharge Instructions     If persists, see ENT [facility]).  Take care  Dr. Lacinda Axon    ED Prescriptions    Medication Sig Dispense Auth. Provider   cefdinir (OMNICEF) 300 MG capsule Take 1 capsule (300 mg total) by mouth 2 (two) times daily. 20 capsule Coral Spikes, DO     Controlled Substance Prescriptions [place] Controlled Substance Registry consulted? Not Applicable   Coral Spikes, DO 07/25/18 1713

## 2018-07-25 NOTE — ED Triage Notes (Signed)
Patient complains of right ear pain. States that 2 weeks ago he started having ear pain and was seen by a NP and diagnosed with Ear infection and given 10 days of augmentin. Patient states that he never felt like he got any better but is now having pain constantly.

## 2018-08-10 ENCOUNTER — Other Ambulatory Visit: Payer: Self-pay | Admitting: Family Medicine

## 2018-08-10 NOTE — Telephone Encounter (Signed)
Please schedule Eric Wells for CPE with fasting labs with Dr. Damita Dunnings.  Once scheduled, please send refill back to [name] to approve medication until his appointment.

## 2018-08-10 NOTE — Telephone Encounter (Signed)
Appointment made for 08/29/18 for CPE and labs prior

## 2018-08-21 ENCOUNTER — Other Ambulatory Visit: Payer: Self-pay | Admitting: Family Medicine

## 2018-08-21 DIAGNOSIS — E7800 Pure hypercholesterolemia, unspecified: Secondary | ICD-10-CM

## 2018-08-22 ENCOUNTER — Other Ambulatory Visit (INDEPENDENT_AMBULATORY_CARE_PROVIDER_SITE_OTHER): Payer: BLUE CROSS/BLUE SHIELD

## 2018-08-22 DIAGNOSIS — E7800 Pure hypercholesterolemia, unspecified: Secondary | ICD-10-CM

## 2018-08-22 LAB — COMPREHENSIVE METABOLIC PANEL
ALBUMIN: 4.2 g/dL (ref 3.5–5.2)
ALT: 31 U/L (ref 0–53)
AST: 15 U/L (ref 0–37)
Alkaline Phosphatase: 64 U/L (ref 39–117)
BUN: 8 mg/dL (ref 6–23)
CALCIUM: 9.2 mg/dL (ref 8.4–10.5)
CO2: 30 mEq/L (ref 19–32)
Chloride: 100 mEq/L (ref 96–112)
Creatinine, Ser: 0.78 mg/dL (ref 0.40–1.50)
GFR: 112.66 mL/min (ref >60.00)
Glucose, Bld: 102 mg/dL — ABNORMAL HIGH (ref 70–99)
Potassium: 3.5 mEq/L (ref 3.5–5.1)
Sodium: 139 mEq/L (ref 135–145)
Total Bilirubin: 0.6 mg/dL (ref 0.2–1.2)
Total Protein: 7 g/dL (ref 6.0–8.3)

## 2018-08-22 LAB — LIPID PANEL
Cholesterol: 258 mg/dL — ABNORMAL HIGH (ref 0–200)
HDL: 30.1 mg/dL — ABNORMAL LOW (ref >39.00)
Total CHOL/HDL Ratio: 9
Triglycerides: 513 mg/dL — ABNORMAL HIGH (ref 0.0–149.0)

## 2018-08-22 LAB — LDL CHOLESTEROL, DIRECT: Direct LDL: 167 mg/dL

## 2018-08-24 DIAGNOSIS — Z5181 Encounter for therapeutic drug level monitoring: Secondary | ICD-10-CM | POA: Diagnosis not present

## 2018-08-24 DIAGNOSIS — Z79899 Other long term (current) drug therapy: Secondary | ICD-10-CM | POA: Diagnosis not present

## 2018-08-24 DIAGNOSIS — M25519 Pain in unspecified shoulder: Secondary | ICD-10-CM | POA: Diagnosis not present

## 2018-08-24 DIAGNOSIS — M5416 Radiculopathy, lumbar region: Secondary | ICD-10-CM | POA: Diagnosis not present

## 2018-08-29 ENCOUNTER — Encounter: Payer: Self-pay | Admitting: Family Medicine

## 2018-08-29 ENCOUNTER — Ambulatory Visit (INDEPENDENT_AMBULATORY_CARE_PROVIDER_SITE_OTHER): Payer: BLUE CROSS/BLUE SHIELD | Admitting: Family Medicine

## 2018-08-29 VITALS — BP 128/84 | HR 80 | Temp 98.4°F | Ht 68.0 in | Wt 202.5 lb

## 2018-08-29 DIAGNOSIS — J441 Chronic obstructive pulmonary disease with (acute) exacerbation: Secondary | ICD-10-CM

## 2018-08-29 DIAGNOSIS — F411 Generalized anxiety disorder: Secondary | ICD-10-CM

## 2018-08-29 DIAGNOSIS — Z0001 Encounter for general adult medical examination with abnormal findings: Secondary | ICD-10-CM

## 2018-08-29 DIAGNOSIS — E785 Hyperlipidemia, unspecified: Secondary | ICD-10-CM

## 2018-08-29 DIAGNOSIS — I10 Essential (primary) hypertension: Secondary | ICD-10-CM

## 2018-08-29 DIAGNOSIS — Z0000 Encounter for general adult medical examination without abnormal findings: Secondary | ICD-10-CM | POA: Diagnosis not present

## 2018-08-29 DIAGNOSIS — E7800 Pure hypercholesterolemia, unspecified: Secondary | ICD-10-CM

## 2018-08-29 DIAGNOSIS — Z7189 Other specified counseling: Secondary | ICD-10-CM

## 2018-08-29 MED ORDER — CLONAZEPAM 1 MG PO TABS: ORAL_TABLET | ORAL | 1 refills | Status: DC

## 2018-08-29 MED ORDER — ATORVASTATIN CALCIUM 40 MG PO TABS: 40.0000 mg | ORAL_TABLET | Freq: Every day | ORAL | 3 refills | Status: DC

## 2018-08-29 MED ORDER — ALBUTEROL SULFATE HFA 108 (90 BASE) MCG/ACT IN AERS: 1.0000 | INHALATION_SPRAY | Freq: Four times a day (QID) | RESPIRATORY_TRACT | 1 refills | Status: DC | PRN

## 2018-08-29 MED ORDER — OXYCODONE HCL 5 MG PO TABS: 5.0000 mg | ORAL_TABLET | Freq: Three times a day (TID) | ORAL | Status: DC | PRN

## 2018-08-29 NOTE — Patient Instructions (Signed)
Restart lipitor and see if you can tolerate that.   Higher potassium foods and increase water intake.   Recheck fasting labs in about 2 months.   Take care.  Glad to see you.  Update me as needed.

## 2018-08-29 NOTE — Progress Notes (Signed)
CPE- See plan.  Routine anticipatory guidance given to patient.  See health maintenance.  The possibility exists that previously documented standard health maintenance information may have been brought forward from a previous encounter into this note.  If needed, that same information has been updated to reflect the current situation based on today's encounter.    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  D/w pt.   Flu encouraged, declined. PNA not due Shingles not due Colon and prostate cancer screening not due.  Living will d/w pt.  Wife designated if patient were incapacitated.   Diet and exercise d/w pt.  HIV screening d/w pt.  [facility].    Smoking cessation d/w pt.  ~1PPD.  Pre-contemplative.  Declines flu shot.  Still with shoulder pain.  He is seeing pain clinic.  Still on oxycodone per outside clinic.  I'll defer. He agrees.  He has a right shoulder MRI pending.  I will defer.  Hypertension:    Using medication without problems or lightheadedness: yes Chest pain with exertion:no Edema:no Short of breath: likely from smoking.  D/w pt.  Some occ wheeze.   He ran out of lisinopril.  Off that med for weeks.    Elevated Cholesterol: Using medications without problems: no ADE on med but off med.   Muscle aches:  Not from statin, but happens occ when off statin.  Diet compliance: Exercise:   He has used BZD prn for anxiety/insomnia.  He is weaning down on the dose/usage.  Routine cautions given.  PMH and SH reviewed  Meds, vitals, and allergies reviewed.   ROS: Per HPI.  Unless specifically indicated otherwise in HPI, the patient denies:  General: fever. Eyes: acute vision changes ENT: sore throat Cardiovascular: chest pain Respiratory: SOB GI: vomiting GU: dysuria Musculoskeletal: acute back pain Derm: acute rash Neuro: acute motor dysfunction Psych: worsening mood Endocrine: polydipsia Heme:  bleeding Allergy: hayfever  GEN: nad, alert and oriented HEENT: mucous membranes moist NECK: supple w/o LA CV: rrr. PULM: ctab except for scant expiratory wheeze noted bilaterally, no inc wob ABD: soft, +bs EXT: no edema SKIN: no acute rash

## 2018-08-30 DIAGNOSIS — M25511 Pain in right shoulder: Secondary | ICD-10-CM | POA: Diagnosis not present

## 2018-08-31 NOTE — Assessment & Plan Note (Signed)
Smoking cessation discussed.  Can use albuterol as needed.  Update me if needed frequently.

## 2018-08-31 NOTE — Assessment & Plan Note (Signed)
Previously able to tolerate statin but off med currently.  Restart.  Recheck labs later on.  Discussed.  He agrees.

## 2018-08-31 NOTE — Assessment & Plan Note (Signed)
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  D/w pt.   Flu encouraged, declined. PNA not due Shingles not due Colon and prostate cancer screening not due.  Living will d/w pt.  Wife designated if patient were incapacitated.   Diet and exercise d/w pt.  HIV screening d/w pt.  [facility].

## 2018-08-31 NOTE — Assessment & Plan Note (Signed)
Reasonable control.  No change in meds.  Needs to stop smoking.  Discussed.

## 2018-08-31 NOTE — Assessment & Plan Note (Signed)
Living will d/w pt.  Wife designated if patient were incapacitated.   ?

## 2018-08-31 NOTE — Assessment & Plan Note (Signed)
He has used BZD prn for anxiety/insomnia.  He is weaning down on the dose/usage.  Routine cautions given.  Continue to try to wean down.  Update me as needed.  Okay for outpatient follow-up

## 2018-09-05 DIAGNOSIS — M25519 Pain in unspecified shoulder: Secondary | ICD-10-CM | POA: Diagnosis not present

## 2018-09-12 DIAGNOSIS — M75111 Incomplete rotator cuff tear or rupture of right shoulder, not specified as traumatic: Secondary | ICD-10-CM | POA: Diagnosis not present

## 2018-09-28 DIAGNOSIS — M7541 Impingement syndrome of right shoulder: Secondary | ICD-10-CM | POA: Diagnosis not present

## 2018-09-28 DIAGNOSIS — M25611 Stiffness of right shoulder, not elsewhere classified: Secondary | ICD-10-CM | POA: Diagnosis not present

## 2018-09-28 DIAGNOSIS — M25511 Pain in right shoulder: Secondary | ICD-10-CM | POA: Diagnosis not present

## 2018-10-12 DIAGNOSIS — M75111 Incomplete rotator cuff tear or rupture of right shoulder, not specified as traumatic: Secondary | ICD-10-CM | POA: Diagnosis not present

## 2018-10-14 DIAGNOSIS — M25511 Pain in right shoulder: Secondary | ICD-10-CM | POA: Diagnosis not present

## 2018-10-14 DIAGNOSIS — M25611 Stiffness of right shoulder, not elsewhere classified: Secondary | ICD-10-CM | POA: Diagnosis not present

## 2018-10-14 DIAGNOSIS — M7541 Impingement syndrome of right shoulder: Secondary | ICD-10-CM | POA: Diagnosis not present

## 2018-10-21 DIAGNOSIS — M7541 Impingement syndrome of right shoulder: Secondary | ICD-10-CM | POA: Diagnosis not present

## 2018-10-21 DIAGNOSIS — M25511 Pain in right shoulder: Secondary | ICD-10-CM | POA: Diagnosis not present

## 2018-10-21 DIAGNOSIS — M25611 Stiffness of right shoulder, not elsewhere classified: Secondary | ICD-10-CM | POA: Diagnosis not present

## 2018-10-26 DIAGNOSIS — M5416 Radiculopathy, lumbar region: Secondary | ICD-10-CM | POA: Diagnosis not present

## 2018-10-27 DIAGNOSIS — M25511 Pain in right shoulder: Secondary | ICD-10-CM | POA: Diagnosis not present

## 2018-10-27 DIAGNOSIS — M25611 Stiffness of right shoulder, not elsewhere classified: Secondary | ICD-10-CM | POA: Diagnosis not present

## 2018-10-27 DIAGNOSIS — M542 Cervicalgia: Secondary | ICD-10-CM | POA: Diagnosis not present

## 2018-10-28 ENCOUNTER — Other Ambulatory Visit: Payer: Self-pay | Admitting: Family Medicine

## 2018-10-28 NOTE — Telephone Encounter (Signed)
Electronic refill request. Clonazepam Last office visit:   08/29/18 Last Filled:    30 tablet 1 08/29/2018  Please advise.

## 2018-10-30 NOTE — Telephone Encounter (Signed)
Sent. Thanks.   

## 2018-11-02 ENCOUNTER — Other Ambulatory Visit: Payer: BLUE CROSS/BLUE SHIELD

## 2018-11-03 DIAGNOSIS — M25511 Pain in right shoulder: Secondary | ICD-10-CM | POA: Diagnosis not present

## 2018-11-03 DIAGNOSIS — M542 Cervicalgia: Secondary | ICD-10-CM | POA: Diagnosis not present

## 2018-11-03 DIAGNOSIS — M25611 Stiffness of right shoulder, not elsewhere classified: Secondary | ICD-10-CM | POA: Diagnosis not present

## 2018-11-28 DIAGNOSIS — M542 Cervicalgia: Secondary | ICD-10-CM | POA: Diagnosis not present

## 2018-11-28 DIAGNOSIS — M25611 Stiffness of right shoulder, not elsewhere classified: Secondary | ICD-10-CM | POA: Diagnosis not present

## 2018-11-28 DIAGNOSIS — M25511 Pain in right shoulder: Secondary | ICD-10-CM | POA: Diagnosis not present

## 2018-12-21 DIAGNOSIS — M5416 Radiculopathy, lumbar region: Secondary | ICD-10-CM | POA: Diagnosis not present

## 2018-12-28 ENCOUNTER — Other Ambulatory Visit: Payer: Self-pay | Admitting: Family Medicine

## 2018-12-29 NOTE — Telephone Encounter (Signed)
Sent. Thanks.   

## 2018-12-29 NOTE — Telephone Encounter (Signed)
Electronic refill request. Clonazepam Last office visit:   08/29/18 Last Filled:   10/30/2018 #30 with 1 refill Please advise.

## 2019-01-30 ENCOUNTER — Other Ambulatory Visit: Payer: Self-pay | Admitting: Family Medicine

## 2019-04-10 DIAGNOSIS — M5416 Radiculopathy, lumbar region: Secondary | ICD-10-CM | POA: Diagnosis not present

## 2019-04-28 ENCOUNTER — Other Ambulatory Visit: Payer: Self-pay | Admitting: Family Medicine

## 2019-05-01 NOTE — Telephone Encounter (Signed)
Patient is overdue for fasting lab work. Left message for patient to call back and get scheduled. Also need to set up CPE for December

## 2019-05-02 ENCOUNTER — Encounter: Payer: Self-pay | Admitting: Family Medicine

## 2019-05-02 NOTE — Telephone Encounter (Signed)
Letter mailed

## 2019-05-30 ENCOUNTER — Telehealth: Payer: Self-pay | Admitting: Family Medicine

## 2019-05-30 ENCOUNTER — Other Ambulatory Visit: Payer: Self-pay

## 2019-05-30 DIAGNOSIS — Z20822 Contact with and (suspected) exposure to covid-19: Secondary | ICD-10-CM

## 2019-05-30 NOTE — Telephone Encounter (Signed)
Noted, will watch for results and then add to monitoring as needed based on results.   Thanks.

## 2019-05-30 NOTE — Telephone Encounter (Signed)
Noted.  Please add him to the follow up list.  Thanks.

## 2019-05-30 NOTE — Telephone Encounter (Signed)
Patient is going today to the [facility] testing site.  He stated that he has been in contacted with someone at his work who tested positive for covid.   Patient aware of address for site and times. Also made aware to quarantine until results come back then he would be advised on what to do next

## 2019-06-01 LAB — NOVEL CORONAVIRUS, NAA: SARS-CoV-2, NAA: NOT DETECTED

## 2019-06-02 NOTE — Telephone Encounter (Addendum)
Noted patient's test results are negative for COVID.  He has been removed from [facility] monitoring list.   I did call patient to make sure he received his results through my chart as posted. (LM detailed on VM) okay per DPR that results were negative and nothing further to do at this point unless he develops any symptoms.  He should then quarantine and call us.

## 2019-06-04 NOTE — Telephone Encounter (Signed)
Noted. Thanks.

## 2019-06-21 DIAGNOSIS — Z79891 Long term (current) use of opiate analgesic: Secondary | ICD-10-CM | POA: Diagnosis not present

## 2019-06-21 DIAGNOSIS — M5416 Radiculopathy, lumbar region: Secondary | ICD-10-CM | POA: Diagnosis not present

## 2019-06-28 ENCOUNTER — Other Ambulatory Visit: Payer: Self-pay | Admitting: Primary Care

## 2019-06-28 DIAGNOSIS — Z202 Contact with and (suspected) exposure to infections with a predominantly sexual mode of transmission: Secondary | ICD-10-CM

## 2019-06-28 MED ORDER — METRONIDAZOLE 500 MG PO TABS: 500.0000 mg | ORAL_TABLET | Freq: Two times a day (BID) | ORAL | 0 refills | Status: DC

## 2019-09-27 DIAGNOSIS — Z79899 Other long term (current) drug therapy: Secondary | ICD-10-CM | POA: Diagnosis not present

## 2019-09-27 DIAGNOSIS — M5412 Radiculopathy, cervical region: Secondary | ICD-10-CM | POA: Diagnosis not present

## 2019-09-27 DIAGNOSIS — M5416 Radiculopathy, lumbar region: Secondary | ICD-10-CM | POA: Diagnosis not present

## 2019-10-06 ENCOUNTER — Ambulatory Visit: Payer: BC Managed Care – PPO | Attending: Internal Medicine

## 2019-10-06 DIAGNOSIS — Z20822 Contact with and (suspected) exposure to covid-19: Secondary | ICD-10-CM

## 2019-10-07 LAB — NOVEL CORONAVIRUS, NAA: SARS-CoV-2, NAA: NOT DETECTED

## 2019-12-13 DIAGNOSIS — Z5181 Encounter for therapeutic drug level monitoring: Secondary | ICD-10-CM | POA: Diagnosis not present

## 2019-12-13 DIAGNOSIS — Z79899 Other long term (current) drug therapy: Secondary | ICD-10-CM | POA: Diagnosis not present

## 2019-12-13 DIAGNOSIS — M5416 Radiculopathy, lumbar region: Secondary | ICD-10-CM | POA: Diagnosis not present

## 2019-12-13 DIAGNOSIS — M7541 Impingement syndrome of right shoulder: Secondary | ICD-10-CM | POA: Diagnosis not present

## 2019-12-13 DIAGNOSIS — M5412 Radiculopathy, cervical region: Secondary | ICD-10-CM | POA: Diagnosis not present

## 2020-01-03 DIAGNOSIS — Z87821 Personal history of retained foreign body fully removed: Secondary | ICD-10-CM | POA: Diagnosis not present

## 2020-01-03 DIAGNOSIS — M25512 Pain in left shoulder: Secondary | ICD-10-CM | POA: Diagnosis not present

## 2020-01-03 DIAGNOSIS — M25511 Pain in right shoulder: Secondary | ICD-10-CM | POA: Diagnosis not present

## 2020-01-03 DIAGNOSIS — M7540 Impingement syndrome of unspecified shoulder: Secondary | ICD-10-CM | POA: Diagnosis not present

## 2020-01-30 ENCOUNTER — Other Ambulatory Visit: Payer: Self-pay | Admitting: *Deleted

## 2020-01-30 MED ORDER — AMLODIPINE BESYLATE 10 MG PO TABS: 10.0000 mg | ORAL_TABLET | Freq: Every day | ORAL | 0 refills | Status: DC

## 2020-03-29 DIAGNOSIS — M7541 Impingement syndrome of right shoulder: Secondary | ICD-10-CM | POA: Diagnosis not present

## 2020-03-29 DIAGNOSIS — Z79899 Other long term (current) drug therapy: Secondary | ICD-10-CM | POA: Diagnosis not present

## 2020-03-29 DIAGNOSIS — M5412 Radiculopathy, cervical region: Secondary | ICD-10-CM | POA: Diagnosis not present

## 2020-03-29 DIAGNOSIS — M5416 Radiculopathy, lumbar region: Secondary | ICD-10-CM | POA: Diagnosis not present

## 2020-04-24 DIAGNOSIS — M5416 Radiculopathy, lumbar region: Secondary | ICD-10-CM | POA: Diagnosis not present

## 2020-04-26 ENCOUNTER — Other Ambulatory Visit: Payer: Self-pay | Admitting: Family Medicine

## 2020-04-26 NOTE — Telephone Encounter (Signed)
Electronic refill request. Amlodipine Last office visit:   08/29/2018 Last Filled:     90 tablet 0 01/30/2020  Patient was given notation at last refill to schedule CPE, none scheduled

## 2020-04-28 NOTE — Telephone Encounter (Signed)
Sent. Thanks.  Note on rx for patient to schedule.

## 2020-06-11 ENCOUNTER — Other Ambulatory Visit: Payer: Self-pay | Admitting: Family Medicine

## 2020-06-11 NOTE — Telephone Encounter (Signed)
Electronic refill request. Amlodipine Last office visit:   06/29/2018 Last Filled:     30 tablet 0 04/28/2020   Message was given at last RF to make appt for CPE,

## 2020-06-12 NOTE — Telephone Encounter (Signed)
Sent. Thanks.   

## 2020-06-14 DIAGNOSIS — Z79891 Long term (current) use of opiate analgesic: Secondary | ICD-10-CM | POA: Diagnosis not present

## 2020-06-14 DIAGNOSIS — Z5181 Encounter for therapeutic drug level monitoring: Secondary | ICD-10-CM | POA: Diagnosis not present

## 2020-06-25 DIAGNOSIS — S83206A Unspecified tear of unspecified meniscus, current injury, right knee, initial encounter: Secondary | ICD-10-CM | POA: Diagnosis not present

## 2020-07-30 ENCOUNTER — Other Ambulatory Visit: Payer: Self-pay | Admitting: Family Medicine

## 2020-07-30 NOTE — Telephone Encounter (Signed)
Appointment scheduled.

## 2020-07-30 NOTE — Telephone Encounter (Signed)
Refill request Norvasc Last office visit 08/29/18 Last refill 06/12/20 #230 Note sent to pharmacy patient needed to schedule appointment for further refills No upcoming appointment scheduled  Please call patient and schedule appointment See back to doctor for refill after appointment is scheduled

## 2020-08-04 NOTE — Telephone Encounter (Signed)
Sent. Thanks.   

## 2020-08-08 ENCOUNTER — Ambulatory Visit (INDEPENDENT_AMBULATORY_CARE_PROVIDER_SITE_OTHER)
Admission: RE | Admit: 2020-08-08 | Discharge: 2020-08-08 | Disposition: A | Discharge status: Home / Self Care | Payer: BC Managed Care – PPO | Source: Ambulatory Visit | Attending: Family Medicine | Admitting: Family Medicine

## 2020-08-08 ENCOUNTER — Encounter: Payer: Self-pay | Admitting: Family Medicine

## 2020-08-08 ENCOUNTER — Ambulatory Visit (INDEPENDENT_AMBULATORY_CARE_PROVIDER_SITE_OTHER): Payer: BC Managed Care – PPO | Admitting: Family Medicine

## 2020-08-08 ENCOUNTER — Other Ambulatory Visit: Payer: Self-pay

## 2020-08-08 VITALS — BP 146/76 | HR 94 | Temp 97.8°F | Wt 204.6 lb

## 2020-08-08 DIAGNOSIS — I10 Essential (primary) hypertension: Secondary | ICD-10-CM | POA: Diagnosis not present

## 2020-08-08 DIAGNOSIS — M25562 Pain in left knee: Secondary | ICD-10-CM

## 2020-08-08 DIAGNOSIS — Z2821 Immunization not carried out because of patient refusal: Secondary | ICD-10-CM | POA: Diagnosis not present

## 2020-08-08 DIAGNOSIS — E785 Hyperlipidemia, unspecified: Secondary | ICD-10-CM

## 2020-08-08 DIAGNOSIS — Z7189 Other specified counseling: Secondary | ICD-10-CM

## 2020-08-08 DIAGNOSIS — Z1211 Encounter for screening for malignant neoplasm of colon: Secondary | ICD-10-CM

## 2020-08-08 NOTE — Progress Notes (Signed)
This visit occurred during the SARS-CoV-2 public health emergency.  Safety protocols were in place, including screening questions prior to the visit, additional usage of staff PPE, and extensive cleaning of exam room while observing appropriate contact time as indicated for disinfecting solutions.  He has surgery planned for R knee, to remove loose calcium deposit per patient report.  More knee pain in the last few months.  He can actually push the deposit in the R knee "when it pops out" and is palpable under the skin.  Now with L knee pain in the last week.  No trauma.  Unclear if left knee pain is due to compensation with baseline right knee pain.  Surgery planned per [facility] in [place] (Dr. Harlow Mares).  Hypertension, hyperlipidemia.  No CP, SOB, BLE edema.  Still amlodipine.  Off statin in the meantime.  He didn't recall ADE on statin prev.  He did not recall the reason to stop it otherwise.  He had neck pain and shoulder pain at baseline.  He is on oxycodone per outside clinic.    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  Longstanding and he had it checked in the past.  Now mildly sore and larger.  Present for years.  Now recently noted umbilical hernia in the last year or so.  Routine cautions d/w pt, re: incarceration.    Living will d/w pt. Wife designated if patient were incapacitated.  Refer for colon cancer screening.   Flu and covid vaccine declined.  He does not have a medical reason not to get vaccinated.  He has fixed false beliefs about vaccine safety and efficacy and is resistant to education. He is not interested in smoking cessation.  ROS: Per HPI unless specifically indicated in ROS section   Meds, vitals, and allergies reviewed.   GEN: nad, alert and oriented HEENT: ncat NECK: supple w/o LA CV: rrr PULM: ctab, no inc wob ABD: soft, +bs, soft abdominal wall hernia noted superior to umbilicus.  He also has soft umbilical hernia, both easily reduced.  No bruising or  erythema. EXT: no edema SKIN: no acute rash Left knee with normal range of motion but he does have patellar click noted.  Able to bear weight.

## 2020-08-08 NOTE — Patient Instructions (Addendum)
Please ask [facility] to send your notes here.  Go to the lab on the way out.   If you have mychart we'll likely use that to update you.    We'll call about seeing GI at [facility].  Let me know if you want to see the general surgeons about the abdominal wall hernia.  I advise you to get vaccinated for covid and the flu. Take care.

## 2020-08-09 LAB — LIPID PANEL
Cholesterol: 247 mg/dL — ABNORMAL HIGH (ref 0–200)
HDL: 25.8 mg/dL — ABNORMAL LOW (ref >39.00)
NonHDL: 221.42
Total CHOL/HDL Ratio: 10
Triglycerides: 288 mg/dL — ABNORMAL HIGH (ref 0.0–149.0)
VLDL: 57.6 mg/dL — ABNORMAL HIGH (ref 0.0–40.0)

## 2020-08-09 LAB — COMPREHENSIVE METABOLIC PANEL
ALT: 45 U/L (ref 0–53)
AST: 27 U/L (ref 0–37)
Albumin: 4.2 g/dL (ref 3.5–5.2)
Alkaline Phosphatase: 69 U/L (ref 39–117)
BUN: 8 mg/dL (ref 6–23)
CO2: 32 mEq/L (ref 19–32)
Calcium: 9.2 mg/dL (ref 8.4–10.5)
Chloride: 100 mEq/L (ref 96–112)
Creatinine, Ser: 0.73 mg/dL (ref 0.40–1.50)
GFR: 106.1 mL/min (ref >60.00)
Glucose, Bld: 108 mg/dL — ABNORMAL HIGH (ref 70–99)
Potassium: 4.1 mEq/L (ref 3.5–5.1)
Sodium: 138 mEq/L (ref 135–145)
Total Bilirubin: 0.5 mg/dL (ref 0.2–1.2)
Total Protein: 7.3 g/dL (ref 6.0–8.3)

## 2020-08-09 LAB — CBC WITH DIFFERENTIAL/PLATELET
Basophils Absolute: 0.2 10*3/uL — ABNORMAL HIGH (ref 0.0–0.1)
Basophils Relative: 1.5 % (ref 0.0–3.0)
Eosinophils Absolute: 0.3 10*3/uL (ref 0.0–0.7)
Eosinophils Relative: 3 % (ref 0.0–5.0)
HCT: 45.3 % (ref 39.0–52.0)
Hemoglobin: 15.4 g/dL (ref 13.0–17.0)
Lymphocytes Relative: 21.1 % (ref 12.0–46.0)
Lymphs Abs: 2.4 10*3/uL (ref 0.7–4.0)
MCHC: 34.1 g/dL (ref 30.0–36.0)
MCV: 84 fl (ref 78.0–100.0)
Monocytes Absolute: 1 10*3/uL (ref 0.1–1.0)
Monocytes Relative: 8.6 % (ref 3.0–12.0)
Neutro Abs: 7.6 10*3/uL (ref 1.4–7.7)
Neutrophils Relative %: 65.8 % (ref 43.0–77.0)
Platelets: 323 10*3/uL (ref 150.0–400.0)
RBC: 5.39 Mil/uL (ref 4.22–5.81)
RDW: 13.5 % (ref 11.5–15.5)
WBC: 11.6 10*3/uL — ABNORMAL HIGH (ref 4.0–10.5)

## 2020-08-09 LAB — LDL CHOLESTEROL, DIRECT: Direct LDL: 188 mg/dL

## 2020-08-11 ENCOUNTER — Other Ambulatory Visit: Payer: Self-pay | Admitting: Family Medicine

## 2020-08-11 DIAGNOSIS — M25562 Pain in left knee: Secondary | ICD-10-CM | POA: Insufficient documentation

## 2020-08-11 DIAGNOSIS — Z1211 Encounter for screening for malignant neoplasm of colon: Secondary | ICD-10-CM | POA: Insufficient documentation

## 2020-08-11 DIAGNOSIS — E7800 Pure hypercholesterolemia, unspecified: Secondary | ICD-10-CM

## 2020-08-11 DIAGNOSIS — Z2821 Immunization not carried out because of patient refusal: Secondary | ICD-10-CM | POA: Insufficient documentation

## 2020-08-11 MED ORDER — ATORVASTATIN CALCIUM 40 MG PO TABS: 40.0000 mg | ORAL_TABLET | Freq: Every day | ORAL | 0 refills | Status: DC

## 2020-08-11 MED ORDER — AMLODIPINE BESYLATE 10 MG PO TABS
10.0000 mg | ORAL_TABLET | Freq: Every day | ORAL | 0 refills | Status: DC
Start: 2020-08-11 — End: 2020-11-12

## 2020-08-11 NOTE — Assessment & Plan Note (Signed)
Living will d/w pt.  Wife designated if patient were incapacitated.   ?

## 2020-08-11 NOTE — Assessment & Plan Note (Signed)
See notes on imaging.  He has follow-up with surgery pending for his right knee.  He is on pain medication per outside clinic.  At this point still okay for outpatient follow-up.  We specifically talked about getting vaccinated before he has surgery but he refused to consider that.

## 2020-08-11 NOTE — Assessment & Plan Note (Signed)
Flu and covid vaccine declined.  He does not have a medical reason not to get vaccinated.  He has fixed false beliefs about vaccine safety and efficacy and is resistant to education.

## 2020-08-11 NOTE — Assessment & Plan Note (Signed)
Refer for colon cancer screening. ?

## 2020-08-11 NOTE — Assessment & Plan Note (Signed)
See notes on labs. 

## 2020-08-11 NOTE — Assessment & Plan Note (Signed)
Continue amlodipine.  See notes on labs.  Not interested in smoking cessation.  Refuses routine vaccination.

## 2020-08-22 DIAGNOSIS — X58XXXA Exposure to other specified factors, initial encounter: Secondary | ICD-10-CM | POA: Diagnosis not present

## 2020-08-22 DIAGNOSIS — Z885 Allergy status to narcotic agent status: Secondary | ICD-10-CM | POA: Diagnosis not present

## 2020-08-22 DIAGNOSIS — M21861 Other specified acquired deformities of right lower leg: Secondary | ICD-10-CM | POA: Diagnosis not present

## 2020-08-22 DIAGNOSIS — M2341 Loose body in knee, right knee: Secondary | ICD-10-CM | POA: Diagnosis not present

## 2020-08-22 DIAGNOSIS — M2419 Other articular cartilage disorders, other specified site: Secondary | ICD-10-CM | POA: Diagnosis not present

## 2020-08-22 DIAGNOSIS — S83241A Other tear of medial meniscus, current injury, right knee, initial encounter: Secondary | ICD-10-CM | POA: Diagnosis not present

## 2020-08-22 DIAGNOSIS — M23321 Other meniscus derangements, posterior horn of medial meniscus, right knee: Secondary | ICD-10-CM | POA: Diagnosis not present

## 2020-08-29 DIAGNOSIS — S83206A Unspecified tear of unspecified meniscus, current injury, right knee, initial encounter: Secondary | ICD-10-CM | POA: Diagnosis not present

## 2020-08-29 DIAGNOSIS — M25661 Stiffness of right knee, not elsewhere classified: Secondary | ICD-10-CM | POA: Diagnosis not present

## 2020-08-29 DIAGNOSIS — M25561 Pain in right knee: Secondary | ICD-10-CM | POA: Diagnosis not present

## 2020-09-11 DIAGNOSIS — M7541 Impingement syndrome of right shoulder: Secondary | ICD-10-CM | POA: Diagnosis not present

## 2020-09-11 DIAGNOSIS — Z79899 Other long term (current) drug therapy: Secondary | ICD-10-CM | POA: Diagnosis not present

## 2020-09-11 DIAGNOSIS — M5412 Radiculopathy, cervical region: Secondary | ICD-10-CM | POA: Diagnosis not present

## 2020-09-11 DIAGNOSIS — M5416 Radiculopathy, lumbar region: Secondary | ICD-10-CM | POA: Diagnosis not present

## 2020-09-11 DIAGNOSIS — Z79891 Long term (current) use of opiate analgesic: Secondary | ICD-10-CM | POA: Diagnosis not present

## 2020-11-10 ENCOUNTER — Other Ambulatory Visit: Payer: Self-pay | Admitting: Family Medicine

## 2020-11-10 DIAGNOSIS — E7800 Pure hypercholesterolemia, unspecified: Secondary | ICD-10-CM

## 2020-11-12 ENCOUNTER — Other Ambulatory Visit: Payer: Self-pay | Admitting: Family Medicine

## 2020-11-12 NOTE — Telephone Encounter (Signed)
Pharmacy requests refill on: Amlodipine 10 mg   LAST REFILL: 08/02/2020 (Q-90, R-0) LAST OV: 08/08/2020 NEXT OV: Not Scheduled  PHARMACY: [id number] [address] [place], [place]

## 2020-11-28 DIAGNOSIS — M5412 Radiculopathy, cervical region: Secondary | ICD-10-CM | POA: Diagnosis not present

## 2020-11-28 DIAGNOSIS — Z79899 Other long term (current) drug therapy: Secondary | ICD-10-CM | POA: Diagnosis not present

## 2020-11-28 DIAGNOSIS — M7541 Impingement syndrome of right shoulder: Secondary | ICD-10-CM | POA: Diagnosis not present

## 2020-11-28 DIAGNOSIS — M5416 Radiculopathy, lumbar region: Secondary | ICD-10-CM | POA: Diagnosis not present

## 2020-12-31 DIAGNOSIS — M5416 Radiculopathy, lumbar region: Secondary | ICD-10-CM | POA: Diagnosis not present

## 2021-02-26 DIAGNOSIS — M5416 Radiculopathy, lumbar region: Secondary | ICD-10-CM | POA: Diagnosis not present

## 2021-02-26 DIAGNOSIS — M5412 Radiculopathy, cervical region: Secondary | ICD-10-CM | POA: Diagnosis not present

## 2021-02-26 DIAGNOSIS — M7541 Impingement syndrome of right shoulder: Secondary | ICD-10-CM | POA: Diagnosis not present

## 2021-02-26 DIAGNOSIS — Z79899 Other long term (current) drug therapy: Secondary | ICD-10-CM | POA: Diagnosis not present

## 2021-02-26 DIAGNOSIS — Z79891 Long term (current) use of opiate analgesic: Secondary | ICD-10-CM | POA: Diagnosis not present

## 2021-02-26 DIAGNOSIS — Z5181 Encounter for therapeutic drug level monitoring: Secondary | ICD-10-CM | POA: Diagnosis not present

## 2021-03-03 DIAGNOSIS — M7541 Impingement syndrome of right shoulder: Secondary | ICD-10-CM | POA: Diagnosis not present

## 2021-03-03 DIAGNOSIS — M25562 Pain in left knee: Secondary | ICD-10-CM | POA: Diagnosis not present

## 2021-03-11 DIAGNOSIS — M7541 Impingement syndrome of right shoulder: Secondary | ICD-10-CM | POA: Diagnosis not present

## 2021-03-18 DIAGNOSIS — M75120 Complete rotator cuff tear or rupture of unspecified shoulder, not specified as traumatic: Secondary | ICD-10-CM | POA: Diagnosis not present

## 2021-04-16 DIAGNOSIS — M25512 Pain in left shoulder: Secondary | ICD-10-CM | POA: Diagnosis not present

## 2021-04-25 DIAGNOSIS — M75122 Complete rotator cuff tear or rupture of left shoulder, not specified as traumatic: Secondary | ICD-10-CM | POA: Diagnosis not present

## 2021-05-22 DIAGNOSIS — G8918 Other acute postprocedural pain: Secondary | ICD-10-CM | POA: Diagnosis not present

## 2021-05-22 DIAGNOSIS — M19012 Primary osteoarthritis, left shoulder: Secondary | ICD-10-CM | POA: Diagnosis not present

## 2021-05-22 DIAGNOSIS — M7522 Bicipital tendinitis, left shoulder: Secondary | ICD-10-CM | POA: Diagnosis not present

## 2021-05-22 DIAGNOSIS — M25512 Pain in left shoulder: Secondary | ICD-10-CM | POA: Diagnosis not present

## 2021-05-22 DIAGNOSIS — M25812 Other specified joint disorders, left shoulder: Secondary | ICD-10-CM | POA: Diagnosis not present

## 2021-05-22 DIAGNOSIS — M75122 Complete rotator cuff tear or rupture of left shoulder, not specified as traumatic: Secondary | ICD-10-CM | POA: Diagnosis not present

## 2021-05-22 DIAGNOSIS — M7542 Impingement syndrome of left shoulder: Secondary | ICD-10-CM | POA: Diagnosis not present

## 2021-05-29 DIAGNOSIS — M25512 Pain in left shoulder: Secondary | ICD-10-CM | POA: Diagnosis not present

## 2021-05-29 DIAGNOSIS — M25612 Stiffness of left shoulder, not elsewhere classified: Secondary | ICD-10-CM | POA: Diagnosis not present

## 2021-06-05 DIAGNOSIS — M5416 Radiculopathy, lumbar region: Secondary | ICD-10-CM | POA: Diagnosis not present

## 2021-06-05 DIAGNOSIS — M7541 Impingement syndrome of right shoulder: Secondary | ICD-10-CM | POA: Diagnosis not present

## 2021-06-05 DIAGNOSIS — Z79899 Other long term (current) drug therapy: Secondary | ICD-10-CM | POA: Diagnosis not present

## 2021-06-05 DIAGNOSIS — M5412 Radiculopathy, cervical region: Secondary | ICD-10-CM | POA: Diagnosis not present

## 2021-06-09 DIAGNOSIS — M25612 Stiffness of left shoulder, not elsewhere classified: Secondary | ICD-10-CM | POA: Diagnosis not present

## 2021-06-09 DIAGNOSIS — M25512 Pain in left shoulder: Secondary | ICD-10-CM | POA: Diagnosis not present

## 2021-06-18 DIAGNOSIS — M25512 Pain in left shoulder: Secondary | ICD-10-CM | POA: Diagnosis not present

## 2021-06-18 DIAGNOSIS — M25612 Stiffness of left shoulder, not elsewhere classified: Secondary | ICD-10-CM | POA: Diagnosis not present

## 2021-06-28 ENCOUNTER — Other Ambulatory Visit: Payer: Self-pay | Admitting: Family Medicine

## 2021-06-30 DIAGNOSIS — M25612 Stiffness of left shoulder, not elsewhere classified: Secondary | ICD-10-CM | POA: Diagnosis not present

## 2021-06-30 DIAGNOSIS — M25512 Pain in left shoulder: Secondary | ICD-10-CM | POA: Diagnosis not present

## 2021-07-01 NOTE — Telephone Encounter (Signed)
Please call and schedule patient CPE

## 2021-07-02 DIAGNOSIS — M5412 Radiculopathy, cervical region: Secondary | ICD-10-CM | POA: Diagnosis not present

## 2021-07-02 DIAGNOSIS — Z79899 Other long term (current) drug therapy: Secondary | ICD-10-CM | POA: Diagnosis not present

## 2021-07-02 DIAGNOSIS — M5416 Radiculopathy, lumbar region: Secondary | ICD-10-CM | POA: Diagnosis not present

## 2021-07-02 DIAGNOSIS — M7541 Impingement syndrome of right shoulder: Secondary | ICD-10-CM | POA: Diagnosis not present

## 2021-07-02 NOTE — Telephone Encounter (Signed)
Left message to schedule CPE

## 2021-07-07 DIAGNOSIS — M25612 Stiffness of left shoulder, not elsewhere classified: Secondary | ICD-10-CM | POA: Diagnosis not present

## 2021-07-07 DIAGNOSIS — M25512 Pain in left shoulder: Secondary | ICD-10-CM | POA: Diagnosis not present

## 2021-07-14 DIAGNOSIS — M25612 Stiffness of left shoulder, not elsewhere classified: Secondary | ICD-10-CM | POA: Diagnosis not present

## 2021-07-14 DIAGNOSIS — M25512 Pain in left shoulder: Secondary | ICD-10-CM | POA: Diagnosis not present

## 2021-08-26 ENCOUNTER — Other Ambulatory Visit: Payer: Self-pay

## 2021-08-26 ENCOUNTER — Ambulatory Visit
Admission: EM | Admit: 2021-08-26 | Discharge: 2021-08-26 | Disposition: A | Discharge status: Home / Self Care | Payer: BC Managed Care – PPO | Attending: Physician Assistant | Admitting: Physician Assistant

## 2021-08-26 ENCOUNTER — Encounter: Payer: Self-pay | Admitting: Emergency Medicine

## 2021-08-26 DIAGNOSIS — R051 Acute cough: Secondary | ICD-10-CM | POA: Diagnosis not present

## 2021-08-26 DIAGNOSIS — J019 Acute sinusitis, unspecified: Secondary | ICD-10-CM | POA: Diagnosis not present

## 2021-08-26 DIAGNOSIS — I10 Essential (primary) hypertension: Secondary | ICD-10-CM | POA: Diagnosis not present

## 2021-08-26 MED ORDER — AMOXICILLIN-POT CLAVULANATE 875-125 MG PO TABS: 1.0000 | ORAL_TABLET | Freq: Two times a day (BID) | ORAL | 0 refills | Status: AC

## 2021-08-26 NOTE — ED Provider Notes (Signed)
[facility]    CSN: [id number] Arrival date & time: 08/26/21  1446      History   Chief Complaint Chief Complaint  Patient presents with   Cough    HPI Eric Wells is a 50 y.o. male presenting for nearly 2-week history of cough and congestion.  Patient reports sore throat at times.  He says he was feeling better until the weekend when he started to have significant headache and sinus pressure as well as a lot of postnasal drainage and increased fatigue.  He has not had any fevers.  Denies any chest pain or breathing problem.  Has been taking over-the-counter Mucinex and decongestants with temporary improvement in symptoms.  No known COVID or flu exposure.  HPI  Past Medical History:  Diagnosis Date   Anxiety    Depression 04/2008   Hosp., suicide risk   Femur fracture ([place]) 1991   Left   Hematuria 2011   Eval with Uro   Kidney stones    Nephrolithiasis    Dr. Terance Hart with Uro   Tendon laceration 1993   Tricep    Patient Active Problem List   Diagnosis Date Noted   Vaccination refused by patient 08/11/2020   Colon cancer screening 08/11/2020   Left knee pain 08/11/2020   Skin lesion 07/29/2017   Encounter for general adult medical examination with abnormal findings 04/08/2017   Advance care planning 04/08/2017   Neck pain 04/08/2017   HTN (hypertension) 02/08/2014   Lipoma 07/24/2013   Pain in joint, ankle and foot 05/12/2013   Erectile dysfunction 05/12/2013   Smoking 11/30/2011   Groin mass 07/05/2011   Headache(784.0) 04/29/2011   CHRONIC OBSTRUCTIVE PULMONARY DISEASE, ACUTE EXACERBATION 02/11/2009   PREDIABETES 08/06/2008   HLD (hyperlipidemia) 08/03/2008   Anxiety state 07/27/2008   Calculus of kidney 07/27/2008    Past Surgical History:  Procedure Laterality Date   CERVICAL SPINE SURGERY  2015   CERVICAL DISCETOMY POSTERIOR C5 TO 6 FUSION WITH INSTRUMENTATION   CERVICAL [place]   LITHOTRIPSY  2011   [place] Medications    Prior to Admission medications   Medication Sig Start Date End Date Taking? Authorizing Provider  amLODipine (NORVASC) 10 MG tablet TAKE 1 TABLET BY MOUTH EVERY DAY 07/01/21  Yes Tonia Ghent, MD  amoxicillin-clavulanate (AUGMENTIN) 875-125 MG tablet Take 1 tablet by mouth every 12 (twelve) hours for 7 days. 08/26/21 09/02/21 Yes Laurene Footman B, PA-C  oxyCODONE (OXY IR/ROXICODONE) 5 MG immediate release tablet Take 1 tablet (5 mg total) by mouth 3 (three) times daily as needed for severe pain. 08/29/18  Yes Tonia Ghent, MD  atorvastatin (LIPITOR) 40 MG tablet TAKE 1 TABLET BY MOUTH EVERY DAY 11/11/20   Tonia Ghent, MD    Family History Family History  Problem Relation Age of Onset   Heart disease Father        MI   Heart disease Maternal Grandmother        CHF   Cancer Maternal Grandfather        Colon CA   Colon cancer Maternal Grandfather    Cancer Paternal Grandmother        Breast CA   Cancer Paternal Grandfather        Lung CA   Prostate cancer Neg Hx     Social  History Social History   Tobacco Use   Smoking status: Former    Packs/day: 1.00    Years: 20.00    Pack years: 20.00    Types: Cigarettes   Smokeless tobacco: Never  Vaping Use   Vaping Use: Never used  Substance Use Topics   Alcohol use: Yes    Alcohol/week: 0.0 standard drinks    Comment: Rare   Drug use: No     Allergies   Cymbalta [duloxetine hcl], Hydromorphone hcl, and Varenicline tartrate   Review of Systems Review of Systems  Constitutional:  Positive for fatigue. Negative for fever.  HENT:  Positive for congestion, postnasal drip, rhinorrhea, sinus pressure, sinus pain, sneezing and sore throat.   Respiratory:  Positive for cough. Negative for shortness of breath and wheezing.   Gastrointestinal:  Negative for abdominal pain, diarrhea, nausea and vomiting.  Musculoskeletal:   Negative for myalgias.  Neurological:  Positive for headaches. Negative for weakness and light-headedness.  Hematological:  Negative for adenopathy.    Physical Exam Triage Vital Signs ED Triage Vitals  Enc Vitals Group     BP 08/26/21 1532 (!) 183/127     Pulse Rate 08/26/21 1532 (!) 102     Resp 08/26/21 1532 18     Temp 08/26/21 1532 98.9 F (37.2 C)     Temp Source 08/26/21 1532 Oral     SpO2 08/26/21 1532 99 %     Weight 08/26/21 1530 204 lb 9.4 oz (92.8 kg)     Height 08/26/21 1530 5\' 8"  (1.727 m)     Head Circumference --      Peak Flow --      Pain Score 08/26/21 1528 6     Pain Loc --      Pain Edu? --      Excl. in [place]? --    No data found.  Updated Vital Signs BP (!) 186/110 (BP Location: Right Arm)   Pulse (!) 102   Temp 98.9 F (37.2 C) (Oral)   Resp 18   Ht 5\' 8"  (1.727 m)   Wt 204 lb 9.4 oz (92.8 kg)   SpO2 99%   BMI 31.11 kg/m       Physical Exam Vitals and nursing note reviewed.  Constitutional:      General: He is not in acute distress.    Appearance: Normal appearance. He is well-developed. He is not ill-appearing.  HENT:     Head: Normocephalic and atraumatic.     Right Ear: A middle ear effusion is present. Tympanic membrane is injected.     Left Ear: A middle ear effusion is present. Tympanic membrane is injected.     Nose: Congestion and rhinorrhea present.     Mouth/Throat:     Mouth: Mucous membranes are moist.     Pharynx: Oropharynx is clear.  Eyes:     General: No scleral icterus.    Conjunctiva/sclera: Conjunctivae normal.  Cardiovascular:     Rate and Rhythm: Normal rate and regular rhythm.     Heart sounds: Normal heart sounds.  Pulmonary:     Effort: Pulmonary effort is normal. No respiratory distress.     Breath sounds: Normal breath sounds.  Musculoskeletal:     Cervical back: Neck supple.  Skin:    General: Skin is warm and dry.     Capillary Refill: Capillary refill takes less than 2 seconds.  Neurological:      General: No focal deficit present.  Mental Status: He is alert. Mental status is at baseline.     Motor: No weakness.     Coordination: Coordination normal.     Gait: Gait normal.  Psychiatric:        Mood and Affect: Mood normal.        Behavior: Behavior normal.        Thought Content: Thought content normal.     UC Treatments / Results  Labs (all labs ordered are listed, but only abnormal results are displayed) Labs Reviewed - No data to display  EKG   Radiology No results found.  Procedures Procedures (including critical care time)  Medications Ordered in UC Medications - No data to display  Initial Impression / Assessment and Plan / UC Course  I have reviewed the triage vital signs and the nursing notes.  Pertinent labs & imaging results that were available during my care of the patient were reviewed by me and considered in my medical decision making (see chart for details).  50 year old male presenting for nearly 2-week history of cough and congestion.  Over the past couple of days has had increased sinus pain and postnasal drainage as well as fatigue.  BP is elevated at 186/110.  Discussed with the patient.  He says he is not good about taking his amlodipine.  Reports he takes it about once a month.  He has not taken it in about a month.  He does have some at home.  I advised him of the importance of resuming it and we discussed his increased risk of heart attack and stroke and other vascular issues if he does not comply with his blood pressure medication.  Discussed checking his BP and keeping a log and following up with his PCP.  Clinical presentation today is concerning for possible secondary bacterial sinusitis.  Will treat with Augmentin.  Also advised Coricidin HBP and nasal sprays.  Increase rest and fluids.  Follow-up here as needed.   Final Clinical Impressions(s) / UC Diagnoses   Final diagnoses:  Acute sinusitis, recurrence not specified, unspecified  location  Acute cough  Essential hypertension     Discharge Instructions      -Take full course of antibiotics. - Your blood pressure is very high.  If you need something for cough you should take Coricidin HBP over-the-counter.  Increase rest and fluids. - Resume your amlodipine.  Keep a log of your blood pressure and follow-up with your PCP especially if it is greater than 140/90.  This would be uncontrolled.     ED Prescriptions     Medication Sig Dispense Auth. Provider   amoxicillin-clavulanate (AUGMENTIN) 875-125 MG tablet Take 1 tablet by mouth every 12 (twelve) hours for 7 days. 14 tablet Gretta Cool      PDMP not reviewed this encounter.   Danton Clap, PA-C 08/26/21 1655

## 2021-08-26 NOTE — ED Triage Notes (Signed)
Pt c/o cough, nasal congestion, sore throat, sinus pressure, and right ear pressure. Started the week of thanksgiving, but states it got worse this past Sunday. Denies fever.

## 2021-08-26 NOTE — Discharge Instructions (Signed)
-  Take full course of antibiotics. - Your blood pressure is very high.  If you need something for cough you should take Coricidin HBP over-the-counter.  Increase rest and fluids. - Resume your amlodipine.  Keep a log of your blood pressure and follow-up with your PCP especially if it is greater than 140/90.  This would be uncontrolled.

## 2021-09-02 DIAGNOSIS — Z9889 Other specified postprocedural states: Secondary | ICD-10-CM | POA: Diagnosis not present

## 2021-09-02 DIAGNOSIS — M25512 Pain in left shoulder: Secondary | ICD-10-CM | POA: Diagnosis not present

## 2021-09-03 DIAGNOSIS — Z79899 Other long term (current) drug therapy: Secondary | ICD-10-CM | POA: Diagnosis not present

## 2021-09-03 DIAGNOSIS — M5412 Radiculopathy, cervical region: Secondary | ICD-10-CM | POA: Diagnosis not present

## 2021-09-03 DIAGNOSIS — M5416 Radiculopathy, lumbar region: Secondary | ICD-10-CM | POA: Diagnosis not present

## 2021-09-03 DIAGNOSIS — M7541 Impingement syndrome of right shoulder: Secondary | ICD-10-CM | POA: Diagnosis not present

## 2021-09-10 DIAGNOSIS — J09X2 Influenza due to identified novel influenza A virus with other respiratory manifestations: Secondary | ICD-10-CM | POA: Diagnosis not present

## 2021-09-26 ENCOUNTER — Other Ambulatory Visit: Payer: Self-pay | Admitting: Family Medicine

## 2021-09-26 NOTE — Telephone Encounter (Signed)
Refill request for Amlodipine 10 mg tab  LOV - 08/08/20 Next OV - not scheduled Last refill - 07/01/21 #90/0

## 2021-09-28 NOTE — Telephone Encounter (Signed)
Please schedule CPE.  Sent. Thanks.

## 2021-09-30 DIAGNOSIS — J440 Chronic obstructive pulmonary disease with acute lower respiratory infection: Secondary | ICD-10-CM | POA: Diagnosis not present

## 2021-10-02 NOTE — Telephone Encounter (Signed)
Called pat and [email or web address] to schedule CPE

## 2021-10-08 DIAGNOSIS — M25512 Pain in left shoulder: Secondary | ICD-10-CM | POA: Diagnosis not present

## 2021-10-08 DIAGNOSIS — Z9889 Other specified postprocedural states: Secondary | ICD-10-CM | POA: Diagnosis not present

## 2021-10-10 DIAGNOSIS — M25512 Pain in left shoulder: Secondary | ICD-10-CM | POA: Diagnosis not present

## 2021-10-10 DIAGNOSIS — M25562 Pain in left knee: Secondary | ICD-10-CM | POA: Diagnosis not present

## 2021-10-10 DIAGNOSIS — M5416 Radiculopathy, lumbar region: Secondary | ICD-10-CM | POA: Diagnosis not present

## 2021-10-10 DIAGNOSIS — Z9889 Other specified postprocedural states: Secondary | ICD-10-CM | POA: Diagnosis not present

## 2021-10-15 DIAGNOSIS — M25512 Pain in left shoulder: Secondary | ICD-10-CM | POA: Diagnosis not present

## 2021-10-15 DIAGNOSIS — Z9889 Other specified postprocedural states: Secondary | ICD-10-CM | POA: Diagnosis not present

## 2021-11-05 DIAGNOSIS — M5412 Radiculopathy, cervical region: Secondary | ICD-10-CM | POA: Diagnosis not present

## 2021-11-05 DIAGNOSIS — Z79899 Other long term (current) drug therapy: Secondary | ICD-10-CM | POA: Diagnosis not present

## 2021-11-05 DIAGNOSIS — M5416 Radiculopathy, lumbar region: Secondary | ICD-10-CM | POA: Diagnosis not present

## 2021-11-05 DIAGNOSIS — Z5181 Encounter for therapeutic drug level monitoring: Secondary | ICD-10-CM | POA: Diagnosis not present

## 2021-11-05 DIAGNOSIS — Z79891 Long term (current) use of opiate analgesic: Secondary | ICD-10-CM | POA: Diagnosis not present

## 2021-11-07 DIAGNOSIS — Z9889 Other specified postprocedural states: Secondary | ICD-10-CM | POA: Diagnosis not present

## 2021-11-07 DIAGNOSIS — M25512 Pain in left shoulder: Secondary | ICD-10-CM | POA: Diagnosis not present

## 2021-12-01 DIAGNOSIS — J349 Unspecified disorder of nose and nasal sinuses: Secondary | ICD-10-CM | POA: Diagnosis not present

## 2021-12-09 DIAGNOSIS — M25512 Pain in left shoulder: Secondary | ICD-10-CM | POA: Diagnosis not present

## 2021-12-25 ENCOUNTER — Other Ambulatory Visit: Payer: Self-pay | Admitting: Family Medicine

## 2021-12-25 NOTE — Telephone Encounter (Signed)
Refill request for amlodipine 10 mg tabs ? ?LOV - 08/08/20 ?Next OV - not scheduled ?Last refill - 09/28/21 #90/0 ? ?

## 2021-12-28 NOTE — Telephone Encounter (Signed)
Sent rx with note to contact clinic for an appointment.  ?

## 2022-01-06 DIAGNOSIS — M7541 Impingement syndrome of right shoulder: Secondary | ICD-10-CM | POA: Diagnosis not present

## 2022-01-24 ENCOUNTER — Other Ambulatory Visit: Payer: Self-pay | Admitting: Family Medicine

## 2022-01-26 NOTE — Telephone Encounter (Signed)
Refill request for Amlodipine 10 mg tabs ? ?LOV - 08/08/20 ?Next OV - not scheduled ?Last refill - 12/28/21 #30/0 ? ?

## 2022-01-27 DIAGNOSIS — M5416 Radiculopathy, lumbar region: Secondary | ICD-10-CM | POA: Diagnosis not present

## 2022-01-27 DIAGNOSIS — Z79899 Other long term (current) drug therapy: Secondary | ICD-10-CM | POA: Diagnosis not present

## 2022-01-27 DIAGNOSIS — M5412 Radiculopathy, cervical region: Secondary | ICD-10-CM | POA: Diagnosis not present

## 2022-01-27 DIAGNOSIS — G894 Chronic pain syndrome: Secondary | ICD-10-CM | POA: Diagnosis not present

## 2022-01-27 NOTE — Telephone Encounter (Signed)
Rx sent with message to contact clinic re: appointment.   ?

## 2022-02-07 ENCOUNTER — Other Ambulatory Visit: Payer: Self-pay | Admitting: Family Medicine

## 2022-02-09 NOTE — Telephone Encounter (Signed)
Refill request for amlodipine 10 mg tabs  LOV - 08/08/20 Next OV - not scheduled Last refill - 01/27/22 #15/0

## 2022-02-10 NOTE — Telephone Encounter (Signed)
Sent. Thanks.   

## 2022-02-10 NOTE — Telephone Encounter (Signed)
Patient scheduled appt for 02/13/22 at 4pm

## 2022-02-10 NOTE — Telephone Encounter (Signed)
We can't continue to fill unless he is going to schedule follow up.  Please call pt to schedule.  Thanks.

## 2022-02-13 ENCOUNTER — Ambulatory Visit (INDEPENDENT_AMBULATORY_CARE_PROVIDER_SITE_OTHER): Payer: BC Managed Care – PPO | Admitting: Family Medicine

## 2022-02-13 ENCOUNTER — Encounter: Payer: Self-pay | Admitting: Family Medicine

## 2022-02-13 VITALS — BP 136/78 | HR 96 | Temp 98.4°F | Ht 68.0 in | Wt 206.0 lb

## 2022-02-13 DIAGNOSIS — Z1211 Encounter for screening for malignant neoplasm of colon: Secondary | ICD-10-CM | POA: Diagnosis not present

## 2022-02-13 DIAGNOSIS — E7800 Pure hypercholesterolemia, unspecified: Secondary | ICD-10-CM

## 2022-02-13 DIAGNOSIS — Z7189 Other specified counseling: Secondary | ICD-10-CM

## 2022-02-13 DIAGNOSIS — K439 Ventral hernia without obstruction or gangrene: Secondary | ICD-10-CM

## 2022-02-13 DIAGNOSIS — Z0000 Encounter for general adult medical examination without abnormal findings: Secondary | ICD-10-CM | POA: Diagnosis not present

## 2022-02-13 DIAGNOSIS — I10 Essential (primary) hypertension: Secondary | ICD-10-CM | POA: Diagnosis not present

## 2022-02-13 DIAGNOSIS — Z0001 Encounter for general adult medical examination with abnormal findings: Secondary | ICD-10-CM

## 2022-02-13 DIAGNOSIS — F411 Generalized anxiety disorder: Secondary | ICD-10-CM

## 2022-02-13 DIAGNOSIS — L989 Disorder of the skin and subcutaneous tissue, unspecified: Secondary | ICD-10-CM

## 2022-02-13 MED ORDER — ATORVASTATIN CALCIUM 40 MG PO TABS: 40.0000 mg | ORAL_TABLET | Freq: Every day | ORAL | 3 refills | Status: DC

## 2022-02-13 MED ORDER — AMLODIPINE BESYLATE 10 MG PO TABS: 10.0000 mg | ORAL_TABLET | Freq: Every day | ORAL | 3 refills | Status: DC

## 2022-02-13 NOTE — Patient Instructions (Addendum)
Let me know if you want to go for the lung cancer screening program.  Go to the lab on the way out.   If you have mychart we'll likely use that to update you.    Take care.  Glad to see you. We'll call about the referrals.   Thanks for your effort.

## 2022-02-13 NOTE — Progress Notes (Unsigned)
CPE- See plan.  Routine anticipatory guidance given to patient.  See health maintenance.  The possibility exists that previously documented standard health maintenance information may have been brought forward from a previous encounter into this note.  If needed, that same information has been updated to reflect the current situation based on today's encounter.    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  D/w pt.   Flu encouraged, declined. PNA not due Shingles d/w pt.   He had covid illness x3 per patient report, d/w pt about vaccine.  Encouraged. D/w patient [id number] for colon cancer screening, including IFOB vs. colonoscopy.  Risks and benefits of both were discussed and patient voiced understanding.  Pt elects for: colonoscopy.   Prostate cancer screening and PSA options (with potential risks and benefits of testing vs not testing) were discussed along with recent recs/guidelines.  He declined testing PSA at this point. Living will d/w pt.  Wife designated if patient were incapacitated.   Diet and exercise d/w pt.  HIV screening d/w pt.  [facility]    D/w pt about lung cancer screening program.  He'll consider.    On oxycodone for knee pain.  He has seen ortho about L rotator cuff, will have surgery soon on R cuff.  And he had R knee scope done.   I will defer to orthopedics.  He agrees.  Hypertension:    Using medication without problems or lightheadedness: yes Chest pain with exertion:no Edema:no Short of breath:no  He had h/o normal stress test years ago.  He has occ L sided discomfort.  Son and his girlfriend moved in with patient.  Stressors d/w pt.  He feels more irritable under stressful situations.  No SI/HI.  He can manage as is and he will update me as needed.  Elevated Cholesterol: Using medications without problems: yes Muscle aches: not from statin.   Diet and exercise d/w pt.    He is vaping but he  quit smoking.  Overall he is still using less than when he was smoking.  He is 15 months out from stopping smoking.  I thanked him for his effort.  His breathing is better than prior.    Hernias tender on L of midline.  Not a new issue, see exam.  PMH and SH reviewed  Meds, vitals, and allergies reviewed.   ROS: Per HPI.  Unless specifically indicated otherwise in HPI, the patient denies:  General: fever. Eyes: acute vision changes ENT: sore throat Cardiovascular: chest pain Respiratory: SOB GI: vomiting GU: dysuria Musculoskeletal: acute back pain Derm: acute rash Neuro: acute motor dysfunction Psych: worsening mood Endocrine: polydipsia Heme: bleeding Allergy: hayfever  GEN: nad, alert and oriented HEENT: mucous membranes moist NECK: supple w/o LA CV: rrr. PULM: ctab, no inc wob ABD: soft, +bs EXT: no edema SKIN: no acute rash noted hernia x2 just L of midline.  Neither feels incarcerated 1cm likely SK on R chest wall.  Likely actinic changes on the hairline.

## 2022-02-14 LAB — COMPREHENSIVE METABOLIC PANEL
AG Ratio: 1.5 (calc) (ref 1.0–2.5)
ALT: 34 U/L (ref 9–46)
AST: 25 U/L (ref 10–35)
Albumin: 4.6 g/dL (ref 3.6–5.1)
Alkaline phosphatase (APISO): 79 U/L (ref 35–144)
BUN: 8 mg/dL (ref 7–25)
CO2: 29 mmol/L (ref 20–32)
Calcium: 9.9 mg/dL (ref 8.6–10.3)
Chloride: 101 mmol/L (ref 98–110)
Creat: 0.8 mg/dL (ref 0.70–1.30)
Globulin: 3 g/dL (calc) (ref 1.9–3.7)
Glucose, Bld: 104 mg/dL — ABNORMAL HIGH (ref 65–99)
Potassium: 4 mmol/L (ref 3.5–5.3)
Sodium: 140 mmol/L (ref 135–146)
Total Bilirubin: 0.6 mg/dL (ref 0.2–1.2)
Total Protein: 7.6 g/dL (ref 6.1–8.1)

## 2022-02-14 LAB — LIPID PANEL
Cholesterol: 295 mg/dL — ABNORMAL HIGH (ref <200)
HDL: 39 mg/dL — ABNORMAL LOW (ref >=40)
LDL Cholesterol (Calc): 211 mg/dL (calc) — ABNORMAL HIGH
Non-HDL Cholesterol (Calc): 256 mg/dL (calc) — ABNORMAL HIGH (ref <130)
Total CHOL/HDL Ratio: 7.6 (calc) — ABNORMAL HIGH (ref <5.0)
Triglycerides: 253 mg/dL — ABNORMAL HIGH (ref <150)

## 2022-02-16 DIAGNOSIS — K439 Ventral hernia without obstruction or gangrene: Secondary | ICD-10-CM | POA: Insufficient documentation

## 2022-02-16 NOTE — Assessment & Plan Note (Signed)
Living will d/w pt.  Wife designated if patient were incapacitated.   ?

## 2022-02-16 NOTE — Assessment & Plan Note (Signed)
He quit smoking, is vaping less than he used to smoke.  I thanked him for his effort.  Continue amlodipine.

## 2022-02-16 NOTE — Assessment & Plan Note (Signed)
Reasonable to see dermatology given his history of sun exposure.  Refer.

## 2022-02-16 NOTE — Assessment & Plan Note (Signed)
See notes on labs.  Continue atorvastatin. 

## 2022-02-16 NOTE — Assessment & Plan Note (Signed)
Versus other social stressor.  His son and his girlfriend moved in with the patient.  He can feel like he is occasionally getting irritable but is manageable.  No suicidal or homicidal intent.  He is going to update me as needed.  We talked about coping strategies.

## 2022-02-16 NOTE — Assessment & Plan Note (Signed)
Routine cautions given to patient.  Refer to general surgery.

## 2022-02-16 NOTE — Assessment & Plan Note (Signed)
  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  D/w pt.   Flu encouraged, declined. PNA not due Shingles d/w pt.   He had covid illness x3 per patient report, d/w pt about vaccine.  Encouraged. D/w patient DK:EUVHAWU for colon cancer screening, including IFOB vs. colonoscopy.  Risks and benefits of both were discussed and patient voiced understanding.  Pt elects for: colonoscopy.   Prostate cancer screening and PSA options (with potential risks and benefits of testing vs not testing) were discussed along with recent recs/guidelines.  He declined testing PSA at this point. Living will d/w pt.  Wife designated if patient were incapacitated.   Diet and exercise d/w pt.  HIV screening d/w pt.  [facility]

## 2022-02-24 ENCOUNTER — Encounter: Payer: Self-pay | Admitting: *Deleted

## 2022-03-02 NOTE — Telephone Encounter (Signed)
Pt called about his 3 referrals from [facility], they were put through on 6.6.2023. They were for Dermatology, General Surgery, GI. Pt stated he has not heard back from any of these to schedule appointments, please advise. Tried to call him back but I could not leave a vm, return a callback when possible, thanks.  Callback Number: [phone]

## 2022-03-05 NOTE — Telephone Encounter (Signed)
All referrals have been faxed to the offices that the patient requested.  Notes are in each referral as well.  I also sent Mychart messages to the patient making him aware of where to call to schedule his appts.

## 2022-03-11 DIAGNOSIS — M75101 Unspecified rotator cuff tear or rupture of right shoulder, not specified as traumatic: Secondary | ICD-10-CM | POA: Diagnosis not present

## 2022-03-31 DIAGNOSIS — L570 Actinic keratosis: Secondary | ICD-10-CM | POA: Diagnosis not present

## 2022-03-31 DIAGNOSIS — L821 Other seborrheic keratosis: Secondary | ICD-10-CM | POA: Diagnosis not present

## 2022-04-02 DIAGNOSIS — K429 Umbilical hernia without obstruction or gangrene: Secondary | ICD-10-CM | POA: Diagnosis not present

## 2022-04-02 DIAGNOSIS — F17290 Nicotine dependence, other tobacco product, uncomplicated: Secondary | ICD-10-CM | POA: Diagnosis not present

## 2022-04-02 DIAGNOSIS — K439 Ventral hernia without obstruction or gangrene: Secondary | ICD-10-CM | POA: Diagnosis not present

## 2022-04-02 DIAGNOSIS — M6208 Separation of muscle (nontraumatic), other site: Secondary | ICD-10-CM | POA: Diagnosis not present

## 2022-04-05 IMAGING — DX DG KNEE COMPLETE 4+V*L*
4 series · 4 of 4 positions shown · non-contrast
Comparison: None.

CLINICAL DATA: Left knee pain.

EXAM:
LEFT KNEE - COMPLETE 4+ VIEW

[knee ap]
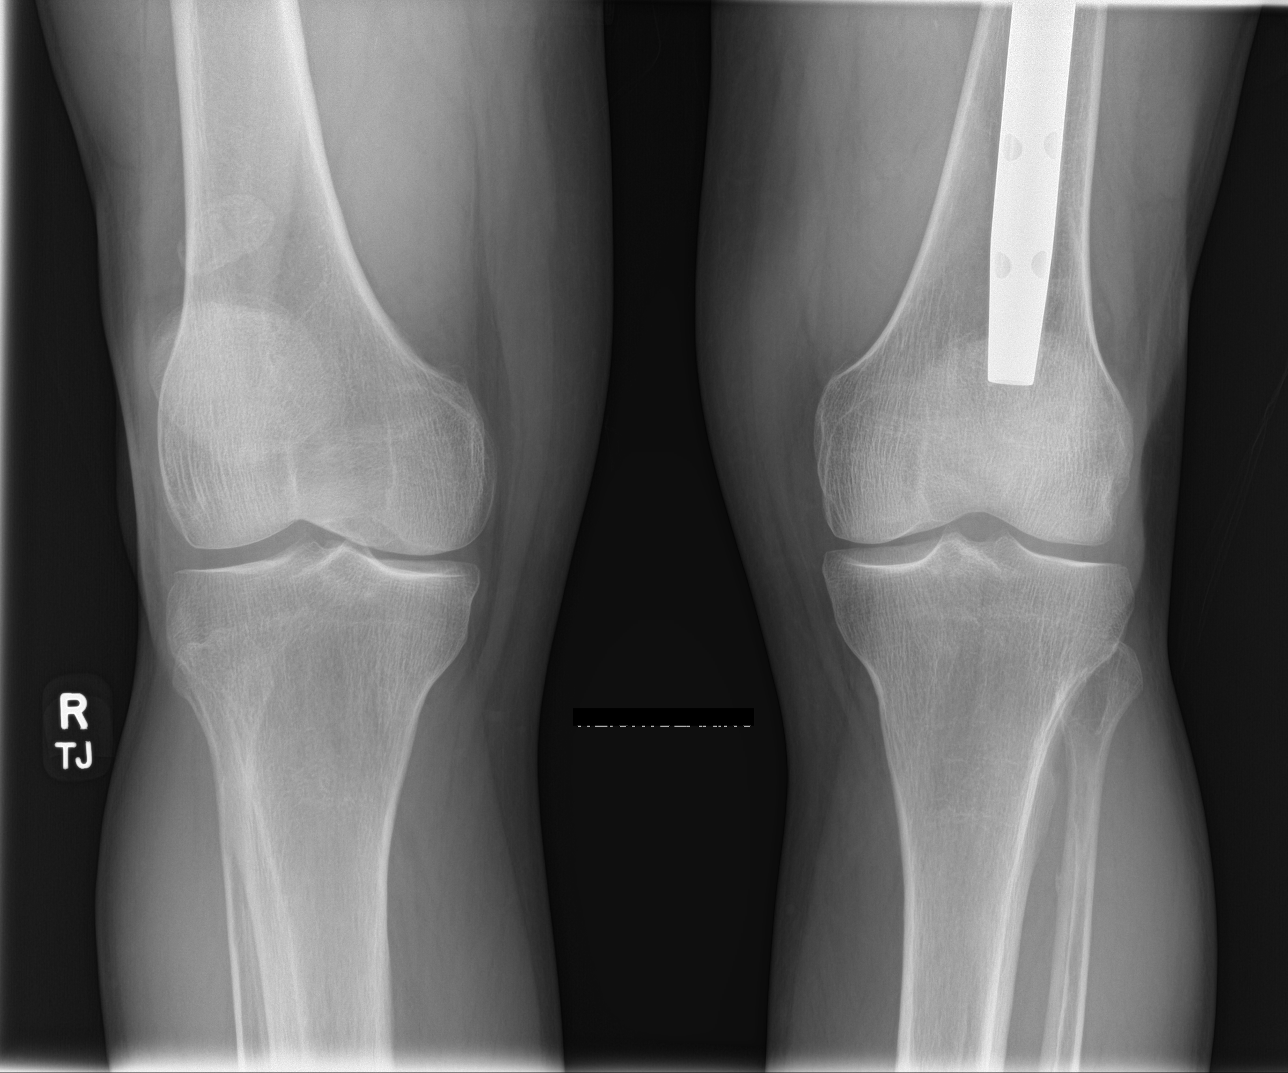

[knee tunnel]
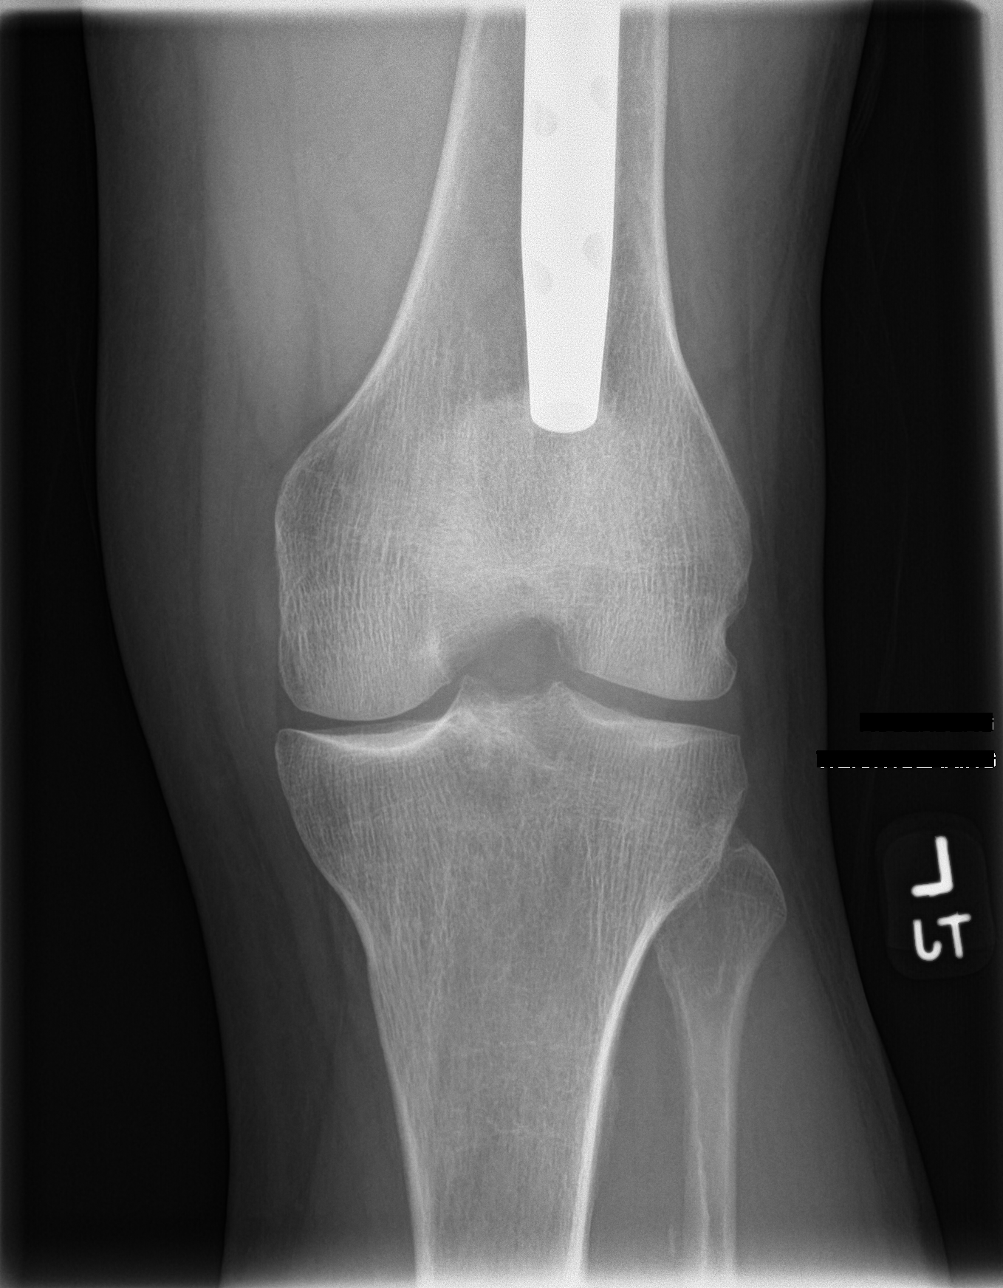

[knee lat]
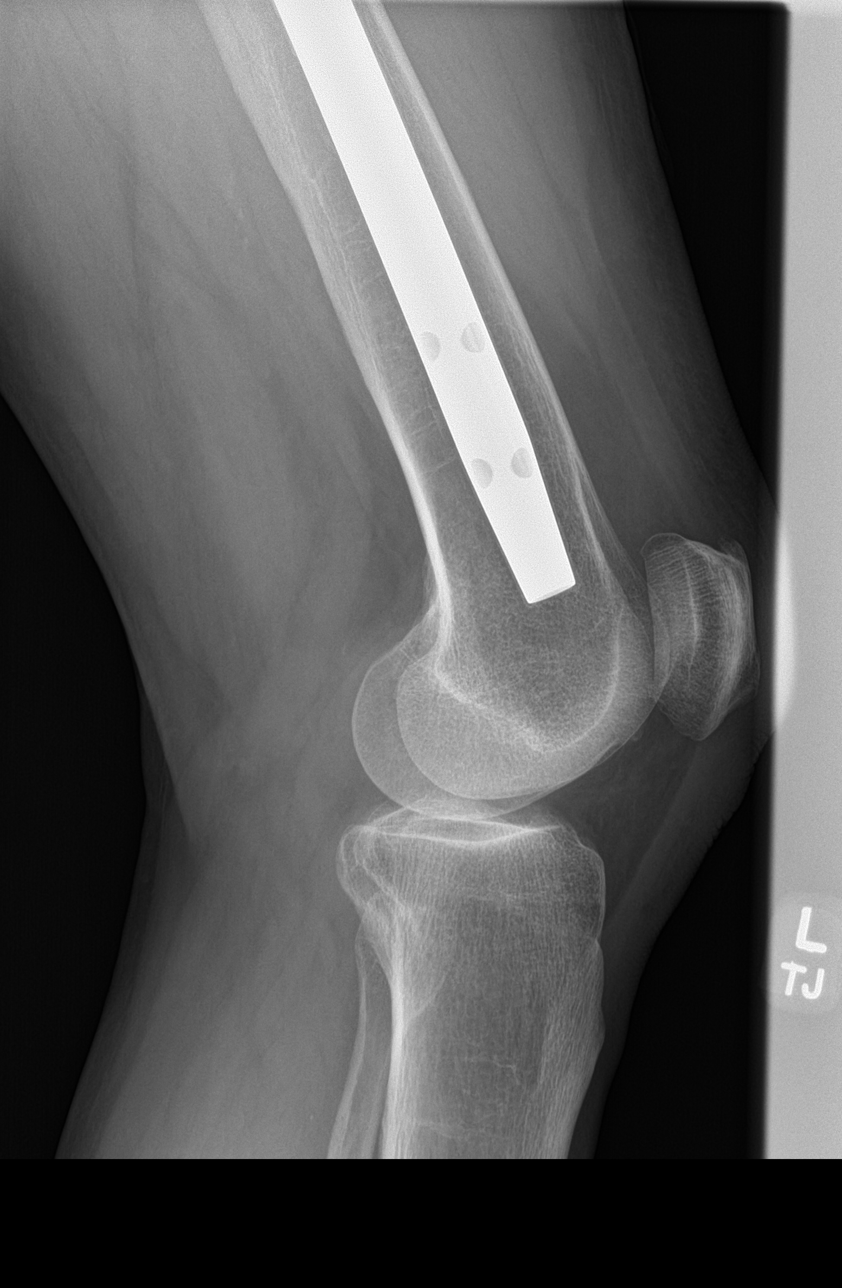

[patella skyline]
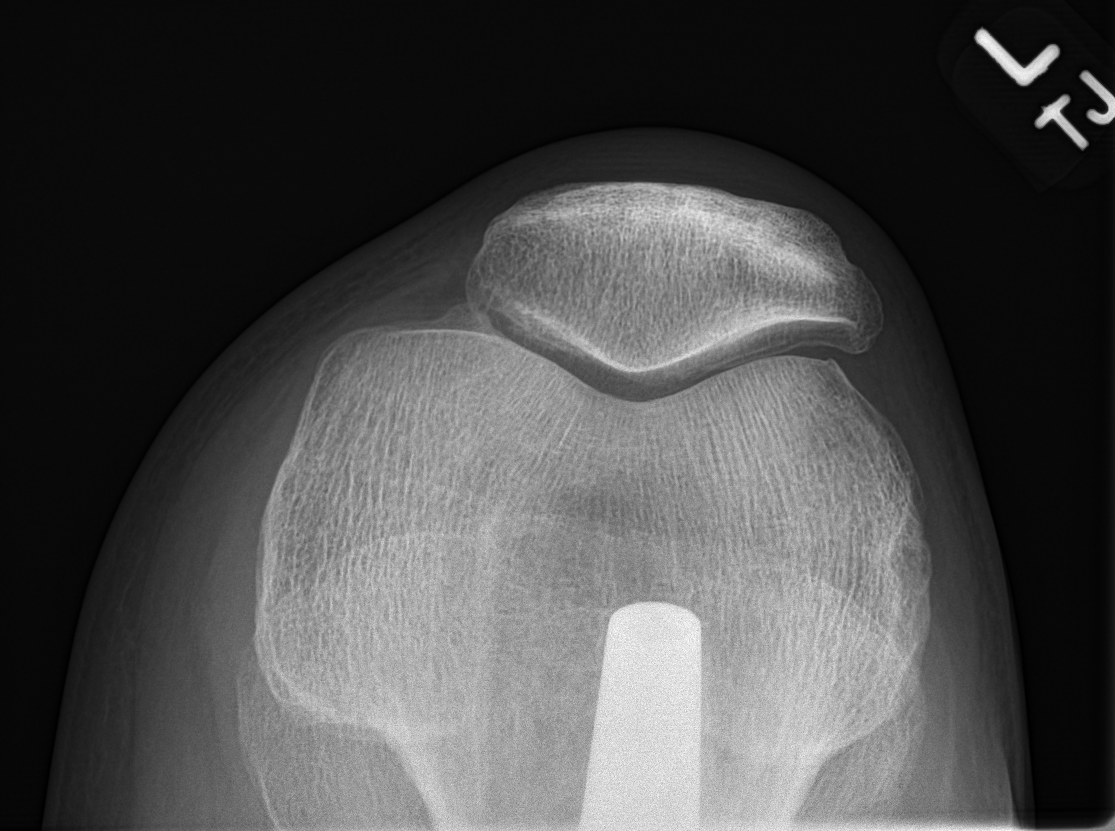

[4 of 4 positions shown; findings below may reference images not displayed]

FINDINGS: No evidence of fracture or dislocation. No significant degenerative
changes. No joint effusion. Partially visualized left femoral
intramedullary nail intact. Well-defined 3 cm calcific density above
the right patella only evaluated on the weight-bearing AP view which
may represent a large loose body.
IMPRESSION: 1. No acute findings.
2. 3 cm calcific density above the right patella which may represent
a large loose body.

## 2022-04-22 DIAGNOSIS — M75101 Unspecified rotator cuff tear or rupture of right shoulder, not specified as traumatic: Secondary | ICD-10-CM | POA: Diagnosis not present

## 2022-04-23 DIAGNOSIS — M7541 Impingement syndrome of right shoulder: Secondary | ICD-10-CM | POA: Diagnosis not present

## 2022-04-23 DIAGNOSIS — M5412 Radiculopathy, cervical region: Secondary | ICD-10-CM | POA: Diagnosis not present

## 2022-04-23 DIAGNOSIS — Z79899 Other long term (current) drug therapy: Secondary | ICD-10-CM | POA: Diagnosis not present

## 2022-04-23 DIAGNOSIS — Z5181 Encounter for therapeutic drug level monitoring: Secondary | ICD-10-CM | POA: Diagnosis not present

## 2022-04-23 DIAGNOSIS — M5416 Radiculopathy, lumbar region: Secondary | ICD-10-CM | POA: Diagnosis not present

## 2022-05-11 DIAGNOSIS — Z6830 Body mass index (BMI) 30.0-30.9, adult: Secondary | ICD-10-CM | POA: Diagnosis not present

## 2022-05-11 DIAGNOSIS — L0291 Cutaneous abscess, unspecified: Secondary | ICD-10-CM | POA: Diagnosis not present

## 2022-05-13 DIAGNOSIS — B001 Herpesviral vesicular dermatitis: Secondary | ICD-10-CM | POA: Diagnosis not present

## 2022-05-13 DIAGNOSIS — Z87891 Personal history of nicotine dependence: Secondary | ICD-10-CM | POA: Diagnosis not present

## 2022-05-13 DIAGNOSIS — L03211 Cellulitis of face: Secondary | ICD-10-CM | POA: Diagnosis not present

## 2022-05-13 DIAGNOSIS — K130 Diseases of lips: Secondary | ICD-10-CM | POA: Diagnosis not present

## 2022-06-24 DIAGNOSIS — E1165 Type 2 diabetes mellitus with hyperglycemia: Secondary | ICD-10-CM | POA: Diagnosis not present

## 2022-06-24 DIAGNOSIS — E119 Type 2 diabetes mellitus without complications: Secondary | ICD-10-CM | POA: Diagnosis not present

## 2022-06-24 DIAGNOSIS — K429 Umbilical hernia without obstruction or gangrene: Secondary | ICD-10-CM | POA: Diagnosis not present

## 2022-06-24 DIAGNOSIS — Z79899 Other long term (current) drug therapy: Secondary | ICD-10-CM | POA: Diagnosis not present

## 2022-06-24 DIAGNOSIS — R638 Other symptoms and signs concerning food and fluid intake: Secondary | ICD-10-CM | POA: Diagnosis not present

## 2022-06-24 DIAGNOSIS — K439 Ventral hernia without obstruction or gangrene: Secondary | ICD-10-CM | POA: Diagnosis not present

## 2022-06-24 DIAGNOSIS — I129 Hypertensive chronic kidney disease with stage 1 through stage 4 chronic kidney disease, or unspecified chronic kidney disease: Secondary | ICD-10-CM | POA: Diagnosis not present

## 2022-06-24 DIAGNOSIS — E1122 Type 2 diabetes mellitus with diabetic chronic kidney disease: Secondary | ICD-10-CM | POA: Diagnosis not present

## 2022-06-24 DIAGNOSIS — Z885 Allergy status to narcotic agent status: Secondary | ICD-10-CM | POA: Diagnosis not present

## 2022-06-24 DIAGNOSIS — E785 Hyperlipidemia, unspecified: Secondary | ICD-10-CM | POA: Diagnosis not present

## 2022-06-24 DIAGNOSIS — Z888 Allergy status to other drugs, medicaments and biological substances status: Secondary | ICD-10-CM | POA: Diagnosis not present

## 2022-06-24 DIAGNOSIS — R000 Tachycardia, unspecified: Secondary | ICD-10-CM | POA: Diagnosis not present

## 2022-06-24 DIAGNOSIS — R9431 Abnormal electrocardiogram [ECG] [EKG]: Secondary | ICD-10-CM | POA: Diagnosis not present

## 2022-06-24 DIAGNOSIS — N189 Chronic kidney disease, unspecified: Secondary | ICD-10-CM | POA: Diagnosis not present

## 2022-06-24 DIAGNOSIS — Z87891 Personal history of nicotine dependence: Secondary | ICD-10-CM | POA: Diagnosis not present

## 2022-06-24 DIAGNOSIS — Z87442 Personal history of urinary calculi: Secondary | ICD-10-CM | POA: Diagnosis not present

## 2022-06-26 ENCOUNTER — Encounter: Payer: Self-pay | Admitting: Family Medicine

## 2022-06-26 DIAGNOSIS — E119 Type 2 diabetes mellitus without complications: Secondary | ICD-10-CM | POA: Insufficient documentation

## 2022-06-26 DIAGNOSIS — E1129 Type 2 diabetes mellitus with other diabetic kidney complication: Secondary | ICD-10-CM | POA: Insufficient documentation

## 2022-07-09 DIAGNOSIS — K439 Ventral hernia without obstruction or gangrene: Secondary | ICD-10-CM | POA: Diagnosis not present

## 2022-07-09 DIAGNOSIS — Z7984 Long term (current) use of oral hypoglycemic drugs: Secondary | ICD-10-CM | POA: Diagnosis not present

## 2022-07-09 DIAGNOSIS — Z79899 Other long term (current) drug therapy: Secondary | ICD-10-CM | POA: Diagnosis not present

## 2022-08-06 DIAGNOSIS — M5412 Radiculopathy, cervical region: Secondary | ICD-10-CM | POA: Diagnosis not present

## 2022-08-06 DIAGNOSIS — M5416 Radiculopathy, lumbar region: Secondary | ICD-10-CM | POA: Diagnosis not present

## 2022-08-06 DIAGNOSIS — Z79899 Other long term (current) drug therapy: Secondary | ICD-10-CM | POA: Diagnosis not present

## 2022-08-06 DIAGNOSIS — M7541 Impingement syndrome of right shoulder: Secondary | ICD-10-CM | POA: Diagnosis not present

## 2022-08-12 DIAGNOSIS — J00 Acute nasopharyngitis [common cold]: Secondary | ICD-10-CM | POA: Diagnosis not present

## 2022-08-12 DIAGNOSIS — J209 Acute bronchitis, unspecified: Secondary | ICD-10-CM | POA: Diagnosis not present

## 2022-10-08 DIAGNOSIS — Y838 Other surgical procedures as the cause of abnormal reaction of the patient, or of later complication, without mention of misadventure at the time of the procedure: Secondary | ICD-10-CM | POA: Diagnosis not present

## 2022-10-08 DIAGNOSIS — R1902 Left upper quadrant abdominal swelling, mass and lump: Secondary | ICD-10-CM | POA: Diagnosis not present

## 2022-10-08 DIAGNOSIS — K439 Ventral hernia without obstruction or gangrene: Secondary | ICD-10-CM | POA: Diagnosis not present

## 2022-10-08 DIAGNOSIS — F17290 Nicotine dependence, other tobacco product, uncomplicated: Secondary | ICD-10-CM | POA: Diagnosis not present

## 2022-10-08 DIAGNOSIS — T8189XA Other complications of procedures, not elsewhere classified, initial encounter: Secondary | ICD-10-CM | POA: Diagnosis not present

## 2022-10-15 DIAGNOSIS — K439 Ventral hernia without obstruction or gangrene: Secondary | ICD-10-CM | POA: Diagnosis not present

## 2022-11-26 DIAGNOSIS — M5416 Radiculopathy, lumbar region: Secondary | ICD-10-CM | POA: Diagnosis not present

## 2022-11-26 DIAGNOSIS — M5412 Radiculopathy, cervical region: Secondary | ICD-10-CM | POA: Diagnosis not present

## 2022-11-26 DIAGNOSIS — M7541 Impingement syndrome of right shoulder: Secondary | ICD-10-CM | POA: Diagnosis not present

## 2022-12-04 DIAGNOSIS — M7542 Impingement syndrome of left shoulder: Secondary | ICD-10-CM | POA: Diagnosis not present

## 2023-02-14 ENCOUNTER — Other Ambulatory Visit: Payer: Self-pay | Admitting: Family Medicine

## 2023-02-14 DIAGNOSIS — E7800 Pure hypercholesterolemia, unspecified: Secondary | ICD-10-CM

## 2023-02-15 ENCOUNTER — Other Ambulatory Visit: Payer: Self-pay | Admitting: Family Medicine

## 2023-02-17 NOTE — Telephone Encounter (Signed)
Patient overdue for CPE; please call to schedule.  

## 2023-02-17 NOTE — Telephone Encounter (Signed)
Refill request for ATORVASTATIN 40 MG TABLET   LOV - 02/13/22 Next OV - not scheduled Last refill - 02/13/22 #90/3

## 2023-02-17 NOTE — Telephone Encounter (Signed)
LOV 02/13/22 NOV 03/16/23 RF 02/13/22 #90/3

## 2023-02-17 NOTE — Telephone Encounter (Signed)
Scheduled cpe for 03/16/23

## 2023-02-18 DIAGNOSIS — M5416 Radiculopathy, lumbar region: Secondary | ICD-10-CM | POA: Diagnosis not present

## 2023-02-18 DIAGNOSIS — R0789 Other chest pain: Secondary | ICD-10-CM | POA: Diagnosis not present

## 2023-03-16 ENCOUNTER — Encounter: Payer: BC Managed Care – PPO | Admitting: Family Medicine

## 2023-03-17 ENCOUNTER — Encounter: Payer: Self-pay | Admitting: Family Medicine

## 2023-05-13 DIAGNOSIS — M5416 Radiculopathy, lumbar region: Secondary | ICD-10-CM | POA: Diagnosis not present

## 2023-05-13 DIAGNOSIS — Z79891 Long term (current) use of opiate analgesic: Secondary | ICD-10-CM | POA: Diagnosis not present

## 2023-08-12 DIAGNOSIS — M7541 Impingement syndrome of right shoulder: Secondary | ICD-10-CM | POA: Diagnosis not present

## 2023-08-12 DIAGNOSIS — M5412 Radiculopathy, cervical region: Secondary | ICD-10-CM | POA: Diagnosis not present

## 2023-08-12 DIAGNOSIS — M5416 Radiculopathy, lumbar region: Secondary | ICD-10-CM | POA: Diagnosis not present

## 2023-12-09 DIAGNOSIS — M5412 Radiculopathy, cervical region: Secondary | ICD-10-CM | POA: Diagnosis not present

## 2023-12-09 DIAGNOSIS — M7541 Impingement syndrome of right shoulder: Secondary | ICD-10-CM | POA: Diagnosis not present

## 2023-12-09 DIAGNOSIS — M4696 Unspecified inflammatory spondylopathy, lumbar region: Secondary | ICD-10-CM | POA: Diagnosis not present

## 2023-12-09 DIAGNOSIS — Z79899 Other long term (current) drug therapy: Secondary | ICD-10-CM | POA: Diagnosis not present

## 2024-01-06 DIAGNOSIS — M25512 Pain in left shoulder: Secondary | ICD-10-CM | POA: Diagnosis not present

## 2024-03-01 ENCOUNTER — Other Ambulatory Visit: Payer: Self-pay | Admitting: Family Medicine

## 2024-03-16 DIAGNOSIS — Z79899 Other long term (current) drug therapy: Secondary | ICD-10-CM | POA: Diagnosis not present

## 2024-03-16 DIAGNOSIS — M5412 Radiculopathy, cervical region: Secondary | ICD-10-CM | POA: Diagnosis not present

## 2024-03-16 DIAGNOSIS — M7712 Lateral epicondylitis, left elbow: Secondary | ICD-10-CM | POA: Diagnosis not present

## 2024-03-16 DIAGNOSIS — M7541 Impingement syndrome of right shoulder: Secondary | ICD-10-CM | POA: Diagnosis not present

## 2024-05-12 ENCOUNTER — Telehealth: Payer: Self-pay | Admitting: Family Medicine

## 2024-05-12 ENCOUNTER — Other Ambulatory Visit: Payer: Self-pay | Admitting: Family Medicine

## 2024-05-12 ENCOUNTER — Telehealth: Payer: Self-pay

## 2024-05-12 MED ORDER — AMLODIPINE BESYLATE 10 MG PO TABS: 10.0000 mg | ORAL_TABLET | Freq: Every day | ORAL | 0 refills | Status: DC

## 2024-05-12 NOTE — Telephone Encounter (Signed)
 A 30 day refill request has been sent

## 2024-05-12 NOTE — Telephone Encounter (Signed)
 Copied from CRM #[id number]. Topic: Clinical - Medication Refill >> May 12, 2024 10:10 AM Eric Wells wrote: Medication: amLODipine  (NORVASC ) 10 MG tablet    Has the patient contacted their pharmacy? Yes (Agent: If no, request that the patient contact the pharmacy for the refill. If patient does not wish to contact the pharmacy document the reason why and proceed with request.) (Agent: If yes, when and what did the pharmacy advise?)  This is the patient's preferred pharmacy:   [id number] [place] [address], [place] - [phone] [place] [address]  Is this the correct pharmacy for this prescription? Yes If no, delete pharmacy and type the correct one.   Has the prescription been filled recently? Yes  Is the patient out of the medication? Yes  Has the patient been seen for an appointment in the last year OR does the patient have an upcoming appointment? Yes  Can we respond through MyChart? Yes  Agent: Please be advised that Rx refills may take up to 3 business days. We ask that you follow-up with your pharmacy.

## 2024-05-12 NOTE — Telephone Encounter (Signed)
 Please call patient and schedule CPE then send message back to me. Patient has not been seen in office since 02/13/2022. Thank you

## 2024-05-12 NOTE — Telephone Encounter (Signed)
 Copied from CRM #[id number]. Topic: Clinical - Medication Refill >> May 12, 2024 10:10 AM Shereese L wrote: Medication: amLODipine  (NORVASC ) 10 MG tablet    Has the patient contacted their pharmacy? Yes (Agent: If no, request that the patient contact the pharmacy for the refill. If patient does not wish to contact the pharmacy document the reason why and proceed with request.) (Agent: If yes, when and what did the pharmacy advise?)  This is the patient's preferred pharmacy:   [id number] [address] [place] - [phone] [place] [address]  Is this the correct pharmacy for this prescription? Yes If no, delete pharmacy and type the correct one.   Has the prescription been filled recently? Yes  Is the patient out of the medication? Yes  Has the patient been seen for an appointment in the last year OR does the patient have an upcoming appointment? Yes  Can we respond through MyChart? Yes  Agent: Please be advised that Rx refills may take up to 3 business days. We ask that you follow-up with your pharmacy.

## 2024-05-12 NOTE — Telephone Encounter (Unsigned)
 Copied from CRM #[id number]. Topic: Clinical - Medication Refill >> May 12, 2024 10:10 AM Eric Wells wrote: Medication: amLODipine  (NORVASC ) 10 MG tablet    Has the patient contacted their pharmacy? Yes (Agent: If no, request that the patient contact the pharmacy for the refill. If patient does not wish to contact the pharmacy document the reason why and proceed with request.) (Agent: If yes, when and what did the pharmacy advise?)  This is the patient's preferred pharmacy:   [id number] [address], [place] - [phone] [place] [address]  Is this the correct pharmacy for this prescription? Yes If no, delete pharmacy and type the correct one.   Has the prescription been filled recently? Yes  Is the patient out of the medication? Yes  Has the patient been seen for an appointment in the last year OR does the patient have an upcoming appointment? Yes  Can we respond through MyChart? Yes  Agent: Please be advised that Rx refills may take up to 3 business days. We ask that you follow-up with your pharmacy.

## 2024-05-12 NOTE — Telephone Encounter (Signed)
 Called pt and schedule appt for cpe/labs

## 2024-05-12 NOTE — Telephone Encounter (Signed)
 Closing encounter. Refill request has been sent

## 2024-05-17 ENCOUNTER — Other Ambulatory Visit: Payer: Self-pay | Admitting: Family Medicine

## 2024-05-17 DIAGNOSIS — E119 Type 2 diabetes mellitus without complications: Secondary | ICD-10-CM

## 2024-05-18 ENCOUNTER — Other Ambulatory Visit (INDEPENDENT_AMBULATORY_CARE_PROVIDER_SITE_OTHER)

## 2024-05-18 DIAGNOSIS — E119 Type 2 diabetes mellitus without complications: Secondary | ICD-10-CM

## 2024-05-18 LAB — LIPID PANEL
Cholesterol: 275 mg/dL — ABNORMAL HIGH (ref 0–200)
HDL: 38.1 mg/dL — ABNORMAL LOW (ref >39.00)
LDL Cholesterol: 186 mg/dL — ABNORMAL HIGH (ref 0–99)
NonHDL: 236.74
Total CHOL/HDL Ratio: 7
Triglycerides: 252 mg/dL — ABNORMAL HIGH (ref 0.0–149.0)
VLDL: 50.4 mg/dL — ABNORMAL HIGH (ref 0.0–40.0)

## 2024-05-18 LAB — COMPREHENSIVE METABOLIC PANEL WITH GFR
ALT: 42 U/L (ref 0–53)
AST: 41 U/L — ABNORMAL HIGH (ref 0–37)
Albumin: 4.5 g/dL (ref 3.5–5.2)
Alkaline Phosphatase: 85 U/L (ref 39–117)
BUN: 9 mg/dL (ref 6–23)
CO2: 30 meq/L (ref 19–32)
Calcium: 9.3 mg/dL (ref 8.4–10.5)
Chloride: 98 meq/L (ref 96–112)
Creatinine, Ser: 0.78 mg/dL (ref 0.40–1.50)
GFR: 101.27 mL/min (ref >60.00)
Glucose, Bld: 162 mg/dL — ABNORMAL HIGH (ref 70–99)
Potassium: 4.3 meq/L (ref 3.5–5.1)
Sodium: 139 meq/L (ref 135–145)
Total Bilirubin: 0.8 mg/dL (ref 0.2–1.2)
Total Protein: 7.4 g/dL (ref 6.0–8.3)

## 2024-05-18 LAB — CBC WITH DIFFERENTIAL/PLATELET
Basophils Absolute: 0.1 K/uL (ref 0.0–0.1)
Basophils Relative: 0.9 % (ref 0.0–3.0)
Eosinophils Absolute: 0.2 K/uL (ref 0.0–0.7)
Eosinophils Relative: 2.7 % (ref 0.0–5.0)
HCT: 47.7 % (ref 39.0–52.0)
Hemoglobin: 16 g/dL (ref 13.0–17.0)
Lymphocytes Relative: 15.9 % (ref 12.0–46.0)
Lymphs Abs: 1.5 K/uL (ref 0.7–4.0)
MCHC: 33.5 g/dL (ref 30.0–36.0)
MCV: 84.2 fl (ref 78.0–100.0)
Monocytes Absolute: 0.8 K/uL (ref 0.1–1.0)
Monocytes Relative: 8.6 % (ref 3.0–12.0)
Neutro Abs: 6.6 K/uL (ref 1.4–7.7)
Neutrophils Relative %: 71.9 % (ref 43.0–77.0)
Platelets: 294 K/uL (ref 150.0–400.0)
RBC: 5.67 Mil/uL (ref 4.22–5.81)
RDW: 13.1 % (ref 11.5–15.5)
WBC: 9.1 K/uL (ref 4.0–10.5)

## 2024-05-18 LAB — MICROALBUMIN / CREATININE URINE RATIO
Creatinine,U: 132.6 mg/dL
Microalb Creat Ratio: 398.2 mg/g — ABNORMAL HIGH (ref 0.0–30.0)
Microalb, Ur: 52.8 mg/dL — ABNORMAL HIGH (ref 0.0–1.9)

## 2024-05-18 LAB — HEMOGLOBIN A1C: Hgb A1c MFr Bld: 7.1 % — ABNORMAL HIGH (ref 4.6–6.5)

## 2024-05-18 LAB — TSH: TSH: 0.52 u[IU]/mL (ref 0.35–5.50)

## 2024-05-22 ENCOUNTER — Ambulatory Visit: Payer: Self-pay | Admitting: Family Medicine

## 2024-05-25 ENCOUNTER — Ambulatory Visit (INDEPENDENT_AMBULATORY_CARE_PROVIDER_SITE_OTHER): Admitting: Family Medicine

## 2024-05-25 ENCOUNTER — Encounter: Payer: Self-pay | Admitting: Family Medicine

## 2024-05-25 VITALS — BP 140/90 | HR 90 | Temp 98.0°F | Ht 67.72 in | Wt 207.4 lb

## 2024-05-25 DIAGNOSIS — K760 Fatty (change of) liver, not elsewhere classified: Secondary | ICD-10-CM

## 2024-05-25 DIAGNOSIS — Z0000 Encounter for general adult medical examination without abnormal findings: Secondary | ICD-10-CM

## 2024-05-25 DIAGNOSIS — Z2821 Immunization not carried out because of patient refusal: Secondary | ICD-10-CM

## 2024-05-25 DIAGNOSIS — M7740 Metatarsalgia, unspecified foot: Secondary | ICD-10-CM

## 2024-05-25 DIAGNOSIS — I10 Essential (primary) hypertension: Secondary | ICD-10-CM

## 2024-05-25 DIAGNOSIS — Z7189 Other specified counseling: Secondary | ICD-10-CM

## 2024-05-25 DIAGNOSIS — E119 Type 2 diabetes mellitus without complications: Secondary | ICD-10-CM

## 2024-05-25 DIAGNOSIS — Z0001 Encounter for general adult medical examination with abnormal findings: Secondary | ICD-10-CM

## 2024-05-25 DIAGNOSIS — R809 Proteinuria, unspecified: Secondary | ICD-10-CM

## 2024-05-25 DIAGNOSIS — E7800 Pure hypercholesterolemia, unspecified: Secondary | ICD-10-CM | POA: Diagnosis not present

## 2024-05-25 DIAGNOSIS — E1129 Type 2 diabetes mellitus with other diabetic kidney complication: Secondary | ICD-10-CM | POA: Diagnosis not present

## 2024-05-25 DIAGNOSIS — Z1211 Encounter for screening for malignant neoplasm of colon: Secondary | ICD-10-CM

## 2024-05-25 DIAGNOSIS — Z7984 Long term (current) use of oral hypoglycemic drugs: Secondary | ICD-10-CM

## 2024-05-25 MED ORDER — AMLODIPINE BESYLATE 10 MG PO TABS: 10.0000 mg | ORAL_TABLET | Freq: Every day | ORAL | 1 refills | Status: DC

## 2024-05-25 MED ORDER — ATORVASTATIN CALCIUM 40 MG PO TABS: 40.0000 mg | ORAL_TABLET | Freq: Every day | ORAL | 1 refills | Status: DC

## 2024-05-25 MED ORDER — LISINOPRIL 2.5 MG PO TABS: 2.5000 mg | ORAL_TABLET | Freq: Every day | ORAL | 1 refills | Status: DC

## 2024-05-25 MED ORDER — METFORMIN HCL 500 MG PO TABS: 500.0000 mg | ORAL_TABLET | Freq: Every day | ORAL | 1 refills | Status: DC

## 2024-05-25 NOTE — Progress Notes (Signed)
 CPE- See plan.  Routine anticipatory guidance given to patient.  See health maintenance.  The possibility exists that previously documented standard health maintenance information may have been brought forward from a previous encounter into this note.  If needed, that same information has been updated to reflect the current situation based on today's encounter.    He lost both parents, his mother had ovarian cancer.  Gave condolences.  He changed jobs and he had two grandkids born since the last time I saw him.    Tetanus encouraged.  Flu encouraged, declined. PNA d/w pt.  Shingles d/w pt.   Covid vaccine encouraged.  Routine vaccination intentionally declined by patient. Lung cancer screening d/w pt, offered and encouraged 2025.  He quit smoking 4 years ago.  He smoked for about 30 years.    D/w patient mz:neupnwd for colon cancer screening, including IFOB vs. colonoscopy.  Risks and benefits of both were discussed and patient voiced understanding.  Pt elects for: colonoscopy.    Prostate cancer screening and PSA options (with potential risks and benefits of testing vs not testing) were discussed along with recent recs/guidelines.  He declined testing PSA at this point.  Living will d/w pt.  Wife designated if patient were incapacitated.   Diet and exercise d/w pt.  HIV screening d/w pt.  [facility]    Diabetes:  Using medications without difficulties: yes, restarted metformin  in the last week.   Hypoglycemic episodes: no Hyperglycemic episodes: no Feet problems: no tingling but B foot plantar pain.  Going on for 2-3 years.  Some days are better than others.  Worse in work books.  Pain along the distal metatarsals on the plantar side. Blood Sugars averaging: not checked often.   eye exam within last year: due, d/w pt.   MALB positive.  Discussed.  Hypertension:    Using medication without problems or lightheadedness: yes Chest pain with  exertion:no Edema:no Short of breath: some, with sig exertion, at baseline.  Former smoker. No wheezing.   Labs d/w pt.    Elevated Cholesterol: Not on statin currently.  Muscle aches:  not with med use prev.   Diet compliance: d/w pt.   Exercise: d/w pt.   He was  off lipitor when labs were collected.  No ADE on med prior.    LFT elevation. Prev CT with diffuse hepatic hypoattenuation consistent with hepatic steatosis.  Discussed.  PMH and SH reviewed  Meds, vitals, and allergies reviewed.   ROS: Per HPI.  Unless specifically indicated otherwise in HPI, the patient denies:  General: fever. Eyes: acute vision changes ENT: sore throat Cardiovascular: chest pain Respiratory: SOB GI: vomiting GU: dysuria Musculoskeletal: acute back pain Derm: acute rash Neuro: acute motor dysfunction Psych: worsening mood Endocrine: polydipsia Heme: bleeding Allergy: hayfever  GEN: nad, alert and oriented HEENT: ncat NECK: supple w/o LA CV: rrr. PULM: ctab, no inc wob ABD: soft, +bs EXT: no edema SKIN: no acute rash, callous on L knee, anterior.    Diabetic foot exam: Normal inspection No skin breakdown No calluses  Normal DP pulses Normal sensation to light touch and monofilament Nails normal

## 2024-05-25 NOTE — Patient Instructions (Addendum)
 Let me know if you want to get set up with lung cancer screening.   Please call about an eye exam.  Let me know if you can't get set up with [facility].  I would keep taking metformin , restart atorvastatin , and then start lisinopril  2.5mg  a day.  Recheck labs prior to a visit in about 3 months.   Use the eat right diet in the meantime.  I would try soft full length arch support inserts.    Take care.  Glad to see you.

## 2024-05-27 DIAGNOSIS — M7740 Metatarsalgia, unspecified foot: Secondary | ICD-10-CM | POA: Insufficient documentation

## 2024-05-27 DIAGNOSIS — K760 Fatty (change of) liver, not elsewhere classified: Secondary | ICD-10-CM | POA: Insufficient documentation

## 2024-05-27 NOTE — Assessment & Plan Note (Signed)
 Routine vaccination intentionally declined by patient. Medically noncompliant regarding vaccines.

## 2024-05-27 NOTE — Assessment & Plan Note (Signed)
 Discussed diabetes, lipids, microalbuminuria.  Discussed risk of ongoing kidney disease.  Continue metformin .  Start lisinopril .  Routine cautions given to patient.

## 2024-05-27 NOTE — Assessment & Plan Note (Signed)
 Discussed association with diabetes.  Needs work on diet and exercise.  Diet handout given and discussed.

## 2024-05-27 NOTE — Assessment & Plan Note (Signed)
 History typical for metatarsalgia.  Discussed using full-length arch support inserts.

## 2024-05-27 NOTE — Assessment & Plan Note (Signed)
 Needs to restart Lipitor.  Statin cautions discussed with patient.  Needs fasting labs before next visit.

## 2024-05-27 NOTE — Assessment & Plan Note (Signed)
 Labs discussed.  Add on lisinopril .  Continue amlodipine .  ACE cautions discussed with patient.

## 2024-05-27 NOTE — Assessment & Plan Note (Signed)
 Tetanus encouraged.  Flu encouraged, declined. PNA d/w pt.  Shingles d/w pt.   Covid vaccine encouraged.  Routine vaccination intentionally declined by patient. Lung cancer screening d/w pt, offered and encouraged 2025.  He quit smoking 4 years ago.  He smoked for about 30 years.    D/w patient mz:neupnwd for colon cancer screening, including IFOB vs. colonoscopy.  Risks and benefits of both were discussed and patient voiced understanding.  Pt elects for: colonoscopy.    Prostate cancer screening and PSA options (with potential risks and benefits of testing vs not testing) were discussed along with recent recs/guidelines.  He declined testing PSA at this point.  Living will d/w pt.  Wife designated if patient were incapacitated.   Diet and exercise d/w pt.  HIV screening d/w pt.  [facility]

## 2024-05-27 NOTE — Assessment & Plan Note (Signed)
 Living will d/w pt.  Wife designated if patient were incapacitated.   ?

## 2024-06-01 DIAGNOSIS — M7541 Impingement syndrome of right shoulder: Secondary | ICD-10-CM | POA: Diagnosis not present

## 2024-06-01 DIAGNOSIS — G8929 Other chronic pain: Secondary | ICD-10-CM | POA: Diagnosis not present

## 2024-06-01 DIAGNOSIS — M25512 Pain in left shoulder: Secondary | ICD-10-CM | POA: Diagnosis not present

## 2024-06-01 DIAGNOSIS — Z79899 Other long term (current) drug therapy: Secondary | ICD-10-CM | POA: Diagnosis not present

## 2024-06-08 DIAGNOSIS — M25511 Pain in right shoulder: Secondary | ICD-10-CM | POA: Diagnosis not present

## 2024-07-07 ENCOUNTER — Encounter: Payer: Self-pay | Admitting: *Deleted

## 2024-07-26 ENCOUNTER — Other Ambulatory Visit

## 2024-07-26 ENCOUNTER — Ambulatory Visit: Payer: Self-pay | Admitting: Family Medicine

## 2024-07-26 DIAGNOSIS — E119 Type 2 diabetes mellitus without complications: Secondary | ICD-10-CM

## 2024-07-26 LAB — HEPATIC FUNCTION PANEL
ALT: 33 U/L (ref 0–53)
AST: 31 U/L (ref 0–37)
Albumin: 4.4 g/dL (ref 3.5–5.2)
Alkaline Phosphatase: 89 U/L (ref 39–117)
Bilirubin, Direct: 0.1 mg/dL (ref 0.0–0.3)
Total Bilirubin: 0.8 mg/dL (ref 0.2–1.2)
Total Protein: 7.2 g/dL (ref 6.0–8.3)

## 2024-07-26 LAB — BASIC METABOLIC PANEL WITH GFR
BUN: 16 mg/dL (ref 6–23)
CO2: 30 meq/L (ref 19–32)
Calcium: 9.6 mg/dL (ref 8.4–10.5)
Chloride: 102 meq/L (ref 96–112)
Creatinine, Ser: 0.84 mg/dL (ref 0.40–1.50)
GFR: 98.9 mL/min (ref >60.00)
Glucose, Bld: 156 mg/dL — ABNORMAL HIGH (ref 70–99)
Potassium: 4.7 meq/L (ref 3.5–5.1)
Sodium: 141 meq/L (ref 135–145)

## 2024-07-26 LAB — LIPID PANEL
Cholesterol: 204 mg/dL — ABNORMAL HIGH (ref 0–200)
HDL: 37.1 mg/dL — ABNORMAL LOW (ref >39.00)
LDL Cholesterol: 138 mg/dL — ABNORMAL HIGH (ref 0–99)
NonHDL: 167.25
Total CHOL/HDL Ratio: 6
Triglycerides: 145 mg/dL (ref 0.0–149.0)
VLDL: 29 mg/dL (ref 0.0–40.0)

## 2024-07-26 LAB — MICROALBUMIN / CREATININE URINE RATIO
Creatinine,U: 88.5 mg/dL
Microalb Creat Ratio: 104.7 mg/g — ABNORMAL HIGH (ref 0.0–30.0)
Microalb, Ur: 9.3 mg/dL — ABNORMAL HIGH (ref 0.0–1.9)

## 2024-07-26 LAB — HEMOGLOBIN A1C: Hgb A1c MFr Bld: 6.6 % — ABNORMAL HIGH (ref 4.6–6.5)

## 2024-08-07 ENCOUNTER — Ambulatory Visit: Admitting: Family Medicine

## 2024-08-07 ENCOUNTER — Ambulatory Visit: Payer: Self-pay | Admitting: Family Medicine

## 2024-08-07 ENCOUNTER — Encounter: Payer: Self-pay | Admitting: Family Medicine

## 2024-08-07 VITALS — BP 122/72 | HR 84 | Temp 98.1°F | Ht 67.72 in | Wt 202.5 lb

## 2024-08-07 DIAGNOSIS — J020 Streptococcal pharyngitis: Secondary | ICD-10-CM

## 2024-08-07 DIAGNOSIS — R051 Acute cough: Secondary | ICD-10-CM | POA: Diagnosis not present

## 2024-08-07 DIAGNOSIS — J029 Acute pharyngitis, unspecified: Secondary | ICD-10-CM | POA: Diagnosis not present

## 2024-08-07 DIAGNOSIS — R52 Pain, unspecified: Secondary | ICD-10-CM

## 2024-08-07 DIAGNOSIS — E119 Type 2 diabetes mellitus without complications: Secondary | ICD-10-CM

## 2024-08-07 DIAGNOSIS — R809 Proteinuria, unspecified: Secondary | ICD-10-CM

## 2024-08-07 DIAGNOSIS — E1129 Type 2 diabetes mellitus with other diabetic kidney complication: Secondary | ICD-10-CM

## 2024-08-07 LAB — POCT RAPID STREP A (OFFICE): Rapid Strep A Screen: POSITIVE — AB

## 2024-08-07 LAB — POCT INFLUENZA A/B
Influenza A, POC: NEGATIVE
Influenza B, POC: NEGATIVE

## 2024-08-07 LAB — POC COVID19 BINAXNOW: SARS Coronavirus 2 Ag: NEGATIVE

## 2024-08-07 MED ORDER — AMOXICILLIN 875 MG PO TABS: 875.0000 mg | ORAL_TABLET | Freq: Two times a day (BID) | ORAL | 0 refills | Status: AC

## 2024-08-07 NOTE — Patient Instructions (Signed)
 Start amoxil , rest and fluids.  Update us  if not improved.   Recheck labs before a visit in about 3 months.  Take care.  Glad to see you.

## 2024-08-07 NOTE — Progress Notes (Unsigned)
 MALB d/w pt.  Improved but still abnormal.  LFTs normalized.   Strep throat.   C/o ST, cough, fatigue, congestion and body aches. Sxs started 08/04/24. Neg home Covid test- 08/05/24. Covid and flu neg.  RST positive.   Meds, vitals, and allergies reviewed.   ROS: Per HPI unless specifically indicated in ROS section   He wanted to defer starting farxiga or similar now.

## 2024-08-09 DIAGNOSIS — J020 Streptococcal pharyngitis: Secondary | ICD-10-CM | POA: Insufficient documentation

## 2024-08-09 NOTE — Assessment & Plan Note (Signed)
 He cut back on [facility].  He is working on diet.  MALB d/w pt.  Improved but still abnormal.  LFTs normalized.  Rationale for diabetes treatment and ACE inhibitor use discussed.He wanted to defer starting farxiga or similar now.    Recheck labs before a visit in about 3 months.

## 2024-08-09 NOTE — Assessment & Plan Note (Signed)
 Start amoxil , rest and fluids.  Update us  if not improved.

## 2024-08-21 ENCOUNTER — Ambulatory Visit: Payer: Self-pay

## 2024-08-21 NOTE — Telephone Encounter (Signed)
 FYI Only or Action Required?: FYI only for provider: appointment scheduled on 08/22/24.  Patient was last seen in primary care on 08/07/2024 by Cleatus Arlyss RAMAN, MD.  Called Nurse Triage reporting Chest Pain.  Symptoms began several days ago.  Interventions attempted: Rest, hydration, or home remedies.  Symptoms are: unchanged.  Triage Disposition: See Physician Within 24 Hours  Patient/caregiver understands and will follow disposition?: Yes  Reason for Disposition  [1] Chest pain lasts < 5 minutes AND [2] NO chest pain or cardiac symptoms (e.g., breathing difficulty, sweating) now  (Exception: Chest pains that last only a few seconds.)  Answer Assessment - Initial Assessment Questions Pt reports 3-6 out of 10 sharp chest pain on the left side of chest and back that is frequent, intermittent and lasts several seconds. Worse with movement or coughing or when laying down. Began 08/16/24. Reports hot flashes that have been occurring for 2 years.   Scheduled for soonest available/next day OV. Pt to call 911 if chest pain radiates, he becomes diaphoretic/sweaty, experiences SOB, if pain worsens or does not resolve in seconds or if any new or worsening symptoms develop. Pt verbalized understanding.  1. LOCATION: Where does it hurt?       Left side of chest and back 2. RADIATION: Does the pain go anywhere else? (e.g., into neck, jaw, arms, back)     Remains on the left side of chest/back, denies radiation 3. ONSET: When did the chest pain begin? (Minutes, hours or days)      08/16/24 4. PATTERN: Does the pain come and go, or has it been constant since it started?  Does it get worse with exertion?      Intermittent, worse with movement or coughing 5. DURATION: How long does it last (e.g., seconds, minutes, hours)     Seconds 6. SEVERITY: How bad is the pain?  (e.g., Scale 1-10; mild, moderate, or severe)     3-6/10 7. CARDIAC RISK FACTORS: Do you have any history of heart  problems or risk factors for heart disease? (e.g., angina, prior heart attack; diabetes, high blood pressure, high cholesterol, smoker, or strong family history of heart disease)     Diabetes and HTN. Father and grandmother died d/t MI.  8. PULMONARY RISK FACTORS: Do you have any history of lung disease?  (e.g., blood clots in lung, asthma, emphysema, birth control pills)     Denies, vapes 9. CAUSE: What do you think is causing the chest pain?     Unknown 10. OTHER SYMPTOMS: Do you have any other symptoms? (e.g., dizziness, nausea, vomiting, sweating, fever, difficulty breathing, cough)       Denies  Protocols used: Chest Pain-A-AH   Copied from CRM #[id number]. Topic: Clinical - Red Word Triage >> Aug 21, 2024  4:43 PM Dedra B wrote: Red Word that prompted transfer to Nurse Triage: Pt is experiencing lung and chest pain. Warm transfer to NT  Past Medical History:  Diagnosis Date   Anxiety    Depression 04/2008   [facility] suicide risk   Diabetes mellitus without complication (HCC)    Femur fracture (HCC) 1991   Left   Hematuria 2011   Eval with [facility]   Hyperlipidemia    Hypertension    Kidney stones    Nephrolithiasis    Dr. Andra with [facility]   Tendon laceration 1993   Tricep

## 2024-08-22 ENCOUNTER — Encounter: Payer: Self-pay | Admitting: Internal Medicine

## 2024-08-22 ENCOUNTER — Other Ambulatory Visit: Payer: Self-pay

## 2024-08-22 ENCOUNTER — Ambulatory Visit (INDEPENDENT_AMBULATORY_CARE_PROVIDER_SITE_OTHER): Admitting: Internal Medicine

## 2024-08-22 VITALS — BP 118/78 | HR 100 | Temp 98.1°F | Resp 16 | Ht 68.0 in | Wt 203.6 lb

## 2024-08-22 DIAGNOSIS — M940 Chondrocostal junction syndrome [Tietze]: Secondary | ICD-10-CM

## 2024-08-22 DIAGNOSIS — R079 Chest pain, unspecified: Secondary | ICD-10-CM

## 2024-08-22 MED ORDER — NAPROXEN 500 MG PO TABS: 500.0000 mg | ORAL_TABLET | Freq: Two times a day (BID) | ORAL | 0 refills | Status: AC

## 2024-08-22 NOTE — Progress Notes (Signed)
 Acute Office Visit  Subjective:     Patient ID: Eric Wells, male    DOB: 1971-09-15, 53 y.o.   MRN: [id number]  Chief Complaint  Patient presents with   Chest Pain    Intermittent for 1 week left side.     Chest Pain  Associated symptoms include a cough. Pertinent negatives include no fever, shortness of breath or sputum production.   Patient is in today for chest pain. This is my first time meeting him.  Discussed the use of AI scribe software for clinical note transcription with the patient, who gave verbal consent to proceed.  History of Present Illness Eric Wells is a 53 year old male with type 2 diabetes who presents with chest pain.  He has had sharp, intermittent left-sided chest pain since before Thanksgiving. The pain lasts seconds and worsens with breathing, coughing, and certain positions such as lying down or turning.  He completed amoxicillin  for strep throat in November, but the chest pain has persisted.  He denies wheezing or shortness of breath. He has a sensation of throat congestion, especially after cold exposure, and a nonproductive evening cough.  He has type 2 diabetes with a recent A1c of 6.6% and improved cholesterol from 290 to 200. He is a former cigarette smoker and currently vapes after about 30 years of smoking.   Review of Systems  Constitutional:  Negative for chills and fever.  HENT:  Negative for congestion, sinus pain and sore throat.   Respiratory:  Positive for cough. Negative for sputum production, shortness of breath and wheezing.   Cardiovascular:  Positive for chest pain.        Objective:    BP 118/78 (Cuff Size: Large)   Pulse 100   Temp 98.1 F (36.7 C) (Oral)   Resp 16   Ht 5' 8 (1.727 m)   Wt 203 lb 9.6 oz (92.4 kg)   SpO2 97%   BMI 30.96 kg/m  BP Readings from Last 3 Encounters:  08/22/24 118/78  08/07/24 122/72  05/25/24 (!) 140/90   Wt Readings from Last 3 Encounters:  08/22/24 203 lb 9.6  oz (92.4 kg)  08/07/24 202 lb 8 oz (91.9 kg)  05/25/24 207 lb 6.4 oz (94.1 kg)      Physical Exam Constitutional:      Appearance: Normal appearance.  HENT:     Head: Normocephalic and atraumatic.     Nose: Nose normal.     Mouth/Throat:     Pharynx: Posterior oropharyngeal erythema present.  Eyes:     Conjunctiva/sclera: Conjunctivae normal.  Cardiovascular:     Rate and Rhythm: Normal rate and regular rhythm.  Pulmonary:     Effort: Pulmonary effort is normal.     Breath sounds: Normal breath sounds. No wheezing, rhonchi or rales.  Musculoskeletal:     Cervical back: No tenderness.  Lymphadenopathy:     Cervical: No cervical adenopathy.  Skin:    General: Skin is warm and dry.  Neurological:     General: No focal deficit present.     Mental Status: He is alert. Mental status is at baseline.  Psychiatric:        Mood and Affect: Mood normal.        Behavior: Behavior normal.     No results found for any visits on 08/22/24.      Assessment & Plan:   Assessment & Plan Chest wall pain (costochondritis vs pleurisy) Intermittent sharp left-sided chest pain, likely  inflammatory, possibly related to recent strep infection. EKG reassuring with normal sinus rhythm in the office today. Lungs clear on exam, no need for chest x-ray at this time unless symptoms change.  - Prescribed Naproxen 500 mg twice daily with food for 5 days. - Advised to monitor symptoms and seek further evaluation if symptoms worsen or new symptoms develop, such as fever or productive cough.  General Health Maintenance Former smoker with 30-year history, currently vaping. Discussed potential lung cancer screening next year based on smoking history. - Discuss lung cancer screening with primary care provider next year.  - EKG 12-Lead - naproxen (NAPROSYN) 500 MG tablet; Take 1 tablet (500 mg total) by mouth 2 (two) times daily with a meal for 5 days.  Dispense: 10 tablet; Refill: 0   Return if  symptoms worsen or fail to improve.  Sharyle Fischer, DO

## 2024-08-31 ENCOUNTER — Other Ambulatory Visit: Payer: Self-pay

## 2024-08-31 ENCOUNTER — Other Ambulatory Visit: Payer: Self-pay | Admitting: Family Medicine

## 2024-08-31 DIAGNOSIS — E7800 Pure hypercholesterolemia, unspecified: Secondary | ICD-10-CM

## 2024-08-31 MED ORDER — LISINOPRIL 2.5 MG PO TABS: 2.5000 mg | ORAL_TABLET | Freq: Every day | ORAL | 1 refills | Status: AC

## 2024-08-31 MED ORDER — ATORVASTATIN CALCIUM 40 MG PO TABS: 40.0000 mg | ORAL_TABLET | Freq: Every day | ORAL | 1 refills | Status: AC

## 2024-08-31 MED ORDER — METFORMIN HCL 500 MG PO TABS: 500.0000 mg | ORAL_TABLET | Freq: Every day | ORAL | 1 refills | Status: AC

## 2024-08-31 NOTE — Telephone Encounter (Unsigned)
 Copied from CRM #[id number]. Topic: Clinical - Medication Refill >> Aug 31, 2024 10:58 AM Drema MATSU wrote: Medication: amLODipine  (NORVASC ) 10 MG tablet, lisinopril  (ZESTRIL ) 2.5 MG tablet  Has the patient contacted their pharmacy? Yes (Agent: If no, request that the patient contact the pharmacy for the refill. If patient does not wish to contact the pharmacy document the reason why and proceed with request.) call provider no refills  (Agent: If yes, when and what did the pharmacy advise?)  This is the patient's preferred pharmacy:  [id number] [place], [place] - [address] [place] [address] Phone: [phone] Fax: [phone]  Is this the correct pharmacy for this prescription? Yes If no, delete pharmacy and type the correct one.   Has the prescription been filled recently? Yes  Is the patient out of the medication? Yes out of lisinopril  and almost out of the other   Has the patient been seen for an appointment in the last year OR does the patient have an upcoming appointment? Yes  Can we respond through MyChart? Yes  Agent: Please be advised that Rx refills may take up to 3 business days. We ask that you follow-up with your pharmacy.

## 2024-09-07 DIAGNOSIS — G8929 Other chronic pain: Secondary | ICD-10-CM | POA: Diagnosis not present

## 2024-09-07 DIAGNOSIS — M7541 Impingement syndrome of right shoulder: Secondary | ICD-10-CM | POA: Diagnosis not present

## 2024-09-07 DIAGNOSIS — Z79899 Other long term (current) drug therapy: Secondary | ICD-10-CM | POA: Diagnosis not present

## 2024-09-07 DIAGNOSIS — M4696 Unspecified inflammatory spondylopathy, lumbar region: Secondary | ICD-10-CM | POA: Diagnosis not present

## 2024-09-19 LAB — OPHTHALMOLOGY REPORT-SCANNED

## 2024-11-08 ENCOUNTER — Other Ambulatory Visit

## 2024-11-13 ENCOUNTER — Ambulatory Visit: Admitting: Family Medicine
